# Patient Record
Sex: Female | Born: 1935 | Race: White | Hispanic: No | Marital: Married | Smoking: Never smoker
Health system: Southern US, Community
[De-identification: ages and names within clinical notes are randomized; demographics above are authoritative.]

## PROBLEM LIST (undated history)

## (undated) DIAGNOSIS — Z8719 Personal history of other diseases of the digestive system: Secondary | ICD-10-CM

## (undated) DIAGNOSIS — Z9889 Other specified postprocedural states: Secondary | ICD-10-CM

## (undated) DIAGNOSIS — E039 Hypothyroidism, unspecified: Secondary | ICD-10-CM

## (undated) DIAGNOSIS — C801 Malignant (primary) neoplasm, unspecified: Secondary | ICD-10-CM

## (undated) DIAGNOSIS — M51369 Other intervertebral disc degeneration, lumbar region without mention of lumbar back pain or lower extremity pain: Secondary | ICD-10-CM

## (undated) DIAGNOSIS — E079 Disorder of thyroid, unspecified: Secondary | ICD-10-CM

## (undated) DIAGNOSIS — I2699 Other pulmonary embolism without acute cor pulmonale: Secondary | ICD-10-CM

## (undated) DIAGNOSIS — F32A Depression, unspecified: Secondary | ICD-10-CM

## (undated) DIAGNOSIS — I10 Essential (primary) hypertension: Secondary | ICD-10-CM

## (undated) DIAGNOSIS — J302 Other seasonal allergic rhinitis: Secondary | ICD-10-CM

## (undated) DIAGNOSIS — M1990 Unspecified osteoarthritis, unspecified site: Secondary | ICD-10-CM

## (undated) DIAGNOSIS — R112 Nausea with vomiting, unspecified: Secondary | ICD-10-CM

## (undated) DIAGNOSIS — M4316 Spondylolisthesis, lumbar region: Secondary | ICD-10-CM

## (undated) DIAGNOSIS — K219 Gastro-esophageal reflux disease without esophagitis: Secondary | ICD-10-CM

## (undated) DIAGNOSIS — F419 Anxiety disorder, unspecified: Secondary | ICD-10-CM

## (undated) HISTORY — PX: GANGLION CYST EXCISION: SHX1691

## (undated) HISTORY — PX: CATARACT EXTRACTION W/ INTRAOCULAR LENS IMPLANT: SHX1309

## (undated) HISTORY — DX: Gastro-esophageal reflux disease without esophagitis: K21.9

## (undated) HISTORY — PX: BACK SURGERY: SHX140

## (undated) HISTORY — DX: Unspecified osteoarthritis, unspecified site: M19.90

## (undated) HISTORY — PX: COLONOSCOPY: SHX174

## (undated) HISTORY — PX: BREAST EXCISIONAL BIOPSY: SUR124

## (undated) HISTORY — DX: Disorder of thyroid, unspecified: E07.9

## (undated) HISTORY — DX: Essential (primary) hypertension: I10

## (undated) HISTORY — PX: ABDOMINAL HYSTERECTOMY: SHX81

## (undated) HISTORY — PX: COLONOSCOPY W/ BIOPSIES AND POLYPECTOMY: SHX1376

---

## 1959-02-23 HISTORY — PX: BREAST SURGERY: SHX581

## 1994-02-22 HISTORY — PX: BREAST SURGERY: SHX581

## 1997-08-30 ENCOUNTER — Other Ambulatory Visit: Admission: RE | Admit: 1997-08-30 | Discharge: 1997-08-30 | Payer: Self-pay | Admitting: Obstetrics and Gynecology

## 1998-09-08 ENCOUNTER — Other Ambulatory Visit: Admission: RE | Admit: 1998-09-08 | Discharge: 1998-09-08 | Payer: Self-pay | Admitting: Obstetrics and Gynecology

## 1999-09-23 ENCOUNTER — Other Ambulatory Visit: Admission: RE | Admit: 1999-09-23 | Discharge: 1999-09-23 | Payer: Self-pay | Admitting: Obstetrics and Gynecology

## 2000-10-03 ENCOUNTER — Other Ambulatory Visit: Admission: RE | Admit: 2000-10-03 | Discharge: 2000-10-03 | Payer: Self-pay | Admitting: Obstetrics and Gynecology

## 2003-05-27 ENCOUNTER — Encounter: Admission: RE | Admit: 2003-05-27 | Discharge: 2003-05-27 | Payer: Self-pay | Admitting: Internal Medicine

## 2003-06-05 ENCOUNTER — Encounter: Admission: RE | Admit: 2003-06-05 | Discharge: 2003-06-05 | Payer: Self-pay | Admitting: Internal Medicine

## 2004-08-07 ENCOUNTER — Ambulatory Visit: Payer: Self-pay | Admitting: Gastroenterology

## 2004-08-18 ENCOUNTER — Ambulatory Visit: Payer: Self-pay | Admitting: Gastroenterology

## 2004-09-04 ENCOUNTER — Ambulatory Visit: Payer: Self-pay | Admitting: Gastroenterology

## 2004-09-17 ENCOUNTER — Ambulatory Visit: Payer: Self-pay | Admitting: Gastroenterology

## 2004-09-17 ENCOUNTER — Ambulatory Visit (HOSPITAL_COMMUNITY): Admission: RE | Admit: 2004-09-17 | Discharge: 2004-09-17 | Payer: Self-pay | Admitting: Gastroenterology

## 2006-11-08 ENCOUNTER — Ambulatory Visit (HOSPITAL_COMMUNITY): Admission: RE | Admit: 2006-11-08 | Discharge: 2006-11-08 | Payer: Self-pay | Admitting: Obstetrics and Gynecology

## 2007-11-10 ENCOUNTER — Ambulatory Visit (HOSPITAL_COMMUNITY): Admission: RE | Admit: 2007-11-10 | Discharge: 2007-11-10 | Payer: Self-pay | Admitting: Obstetrics and Gynecology

## 2008-03-15 ENCOUNTER — Encounter: Admission: RE | Admit: 2008-03-15 | Discharge: 2008-03-15 | Payer: Self-pay | Admitting: Podiatry

## 2008-12-03 ENCOUNTER — Ambulatory Visit (HOSPITAL_COMMUNITY): Admission: RE | Admit: 2008-12-03 | Discharge: 2008-12-03 | Payer: Self-pay | Admitting: Obstetrics and Gynecology

## 2010-01-26 ENCOUNTER — Ambulatory Visit (HOSPITAL_COMMUNITY)
Admission: RE | Admit: 2010-01-26 | Discharge: 2010-01-26 | Payer: Self-pay | Source: Home / Self Care | Admitting: Obstetrics and Gynecology

## 2010-02-12 ENCOUNTER — Encounter
Admission: RE | Admit: 2010-02-12 | Discharge: 2010-02-12 | Payer: Self-pay | Source: Home / Self Care | Attending: Internal Medicine | Admitting: Internal Medicine

## 2010-02-19 ENCOUNTER — Encounter
Admission: RE | Admit: 2010-02-19 | Discharge: 2010-02-19 | Payer: Self-pay | Source: Home / Self Care | Attending: Internal Medicine | Admitting: Internal Medicine

## 2011-01-11 ENCOUNTER — Other Ambulatory Visit (HOSPITAL_COMMUNITY): Payer: Self-pay | Admitting: Obstetrics

## 2011-01-11 DIAGNOSIS — Z1231 Encounter for screening mammogram for malignant neoplasm of breast: Secondary | ICD-10-CM

## 2011-02-10 ENCOUNTER — Ambulatory Visit (HOSPITAL_COMMUNITY)
Admission: RE | Admit: 2011-02-10 | Discharge: 2011-02-10 | Disposition: A | Discharge status: Home / Self Care | Payer: Medicare HMO | Source: Ambulatory Visit | Attending: Obstetrics | Admitting: Obstetrics

## 2011-02-10 DIAGNOSIS — Z1231 Encounter for screening mammogram for malignant neoplasm of breast: Secondary | ICD-10-CM | POA: Insufficient documentation

## 2012-01-03 ENCOUNTER — Other Ambulatory Visit: Payer: Self-pay | Admitting: Obstetrics

## 2012-01-03 DIAGNOSIS — Z1231 Encounter for screening mammogram for malignant neoplasm of breast: Secondary | ICD-10-CM

## 2012-02-11 ENCOUNTER — Ambulatory Visit
Admission: RE | Admit: 2012-02-11 | Discharge: 2012-02-11 | Disposition: A | Discharge status: Home / Self Care | Payer: Medicare Other | Source: Ambulatory Visit | Attending: Obstetrics | Admitting: Obstetrics

## 2012-02-11 DIAGNOSIS — Z1231 Encounter for screening mammogram for malignant neoplasm of breast: Secondary | ICD-10-CM

## 2012-05-02 ENCOUNTER — Other Ambulatory Visit: Payer: Self-pay

## 2013-01-08 ENCOUNTER — Other Ambulatory Visit: Payer: Self-pay

## 2013-01-08 DIAGNOSIS — Z1231 Encounter for screening mammogram for malignant neoplasm of breast: Secondary | ICD-10-CM

## 2013-01-17 ENCOUNTER — Encounter: Payer: Self-pay | Admitting: Podiatry

## 2013-01-22 ENCOUNTER — Encounter: Payer: Self-pay | Admitting: Podiatry

## 2013-01-22 ENCOUNTER — Ambulatory Visit (INDEPENDENT_AMBULATORY_CARE_PROVIDER_SITE_OTHER): Payer: Medicare Other | Admitting: Podiatry

## 2013-01-22 ENCOUNTER — Ambulatory Visit (INDEPENDENT_AMBULATORY_CARE_PROVIDER_SITE_OTHER): Payer: Medicare Other

## 2013-01-22 DIAGNOSIS — M79609 Pain in unspecified limb: Secondary | ICD-10-CM

## 2013-01-22 DIAGNOSIS — M722 Plantar fascial fibromatosis: Secondary | ICD-10-CM

## 2013-01-22 MED ORDER — TRIAMCINOLONE ACETONIDE 10 MG/ML IJ SUSP
10.0000 mg | Freq: Once | INTRAMUSCULAR | Status: AC
  Administered 2013-01-22: 10 mg

## 2013-01-22 NOTE — Patient Instructions (Signed)
Plantar Fasciitis (Heel Spur Syndrome)  with Rehab  The plantar fascia is a fibrous, ligament-like, soft-tissue structure that spans the bottom of the foot. Plantar fasciitis is a condition that causes pain in the foot due to inflammation of the tissue.  SYMPTOMS   · Pain and tenderness on the underneath side of the foot.  · Pain that worsens with standing or walking.  CAUSES   Plantar fasciitis is caused by irritation and injury to the plantar fascia on the underneath side of the foot. Common mechanisms of injury include:  · Direct trauma to bottom of the foot.  · Damage to a small nerve that runs under the foot where the main fascia attaches to the heel bone.  · Stress placed on the plantar fascia due to bone spurs.  RISK INCREASES WITH:   · Activities that place stress on the plantar fascia (running, jumping, pivoting, or cutting).  · Poor strength and flexibility.  · Improperly fitted shoes.  · Tight calf muscles.  · Flat feet.  · Failure to warm-up properly before activity.  · Obesity.  PREVENTION  · Warm up and stretch properly before activity.  · Allow for adequate recovery between workouts.  · Maintain physical fitness:  · Strength, flexibility, and endurance.  · Cardiovascular fitness.  · Maintain a health body weight.  · Avoid stress on the plantar fascia.  · Wear properly fitted shoes, including arch supports for individuals who have flat feet.  PROGNOSIS   If treated properly, then the symptoms of plantar fasciitis usually resolve without surgery. However, occasionally surgery is necessary.  RELATED COMPLICATIONS   · Recurrent symptoms that may result in a chronic condition.  · Problems of the lower back that are caused by compensating for the injury, such as limping.  · Pain or weakness of the foot during push-off following surgery.  · Chronic inflammation, scarring, and partial or complete fascia tear, occurring more often from repeated injections.  TREATMENT   Treatment initially involves the use of  ice and medication to help reduce pain and inflammation. The use of strengthening and stretching exercises may help reduce pain with activity, especially stretches of the Achilles tendon. These exercises may be performed at home or with a therapist. Your caregiver may recommend that you use heel cups of arch supports to help reduce stress on the plantar fascia. Occasionally, corticosteroid injections are given to reduce inflammation. If symptoms persist for greater than 6 months despite non-surgical (conservative), then surgery may be recommended.   MEDICATION   · If pain medication is necessary, then nonsteroidal anti-inflammatory medications, such as aspirin and ibuprofen, or other minor pain relievers, such as acetaminophen, are often recommended.  · Do not take pain medication within 7 days before surgery.  · Prescription pain relievers may be given if deemed necessary by your caregiver. Use only as directed and only as much as you need.  · Corticosteroid injections may be given by your caregiver. These injections should be reserved for the most serious cases, because they may only be given a certain number of times.  HEAT AND COLD  · Cold treatment (icing) relieves pain and reduces inflammation. Cold treatment should be applied for 10 to 15 minutes every 2 to 3 hours for inflammation and pain and immediately after any activity that aggravates your symptoms. Use ice packs or massage the area with a piece of ice (ice massage).  · Heat treatment may be used prior to performing the stretching and strengthening activities prescribed   by your caregiver, physical therapist, or athletic trainer. Use a heat pack or soak the injury in warm water.  SEEK IMMEDIATE MEDICAL CARE IF:  · Treatment seems to offer no benefit, or the condition worsens.  · Any medications produce adverse side effects.  EXERCISES  RANGE OF MOTION (ROM) AND STRETCHING EXERCISES - Plantar Fasciitis (Heel Spur Syndrome)  These exercises may help you  when beginning to rehabilitate your injury. Your symptoms may resolve with or without further involvement from your physician, physical therapist or athletic trainer. While completing these exercises, remember:   · Restoring tissue flexibility helps normal motion to return to the joints. This allows healthier, less painful movement and activity.  · An effective stretch should be held for at least 30 seconds.  · A stretch should never be painful. You should only feel a gentle lengthening or release in the stretched tissue.  RANGE OF MOTION - Toe Extension, Flexion  · Sit with your right / left leg crossed over your opposite knee.  · Grasp your toes and gently pull them back toward the top of your foot. You should feel a stretch on the bottom of your toes and/or foot.  · Hold this stretch for __________ seconds.  · Now, gently pull your toes toward the bottom of your foot. You should feel a stretch on the top of your toes and or foot.  · Hold this stretch for __________ seconds.  Repeat __________ times. Complete this stretch __________ times per day.   RANGE OF MOTION - Ankle Dorsiflexion, Active Assisted  · Remove shoes and sit on a chair that is preferably not on a carpeted surface.  · Place right / left foot under knee. Extend your opposite leg for support.  · Keeping your heel down, slide your right / left foot back toward the chair until you feel a stretch at your ankle or calf. If you do not feel a stretch, slide your bottom forward to the edge of the chair, while still keeping your heel down.  · Hold this stretch for __________ seconds.  Repeat __________ times. Complete this stretch __________ times per day.   STRETCH  Gastroc, Standing  · Place hands on wall.  · Extend right / left leg, keeping the front knee somewhat bent.  · Slightly point your toes inward on your back foot.  · Keeping your right / left heel on the floor and your knee straight, shift your weight toward the wall, not allowing your back to  arch.  · You should feel a gentle stretch in the right / left calf. Hold this position for __________ seconds.  Repeat __________ times. Complete this stretch __________ times per day.  STRETCH  Soleus, Standing  · Place hands on wall.  · Extend right / left leg, keeping the other knee somewhat bent.  · Slightly point your toes inward on your back foot.  · Keep your right / left heel on the floor, bend your back knee, and slightly shift your weight over the back leg so that you feel a gentle stretch deep in your back calf.  · Hold this position for __________ seconds.  Repeat __________ times. Complete this stretch __________ times per day.  STRETCH  Gastrocsoleus, Standing   Note: This exercise can place a lot of stress on your foot and ankle. Please complete this exercise only if specifically instructed by your caregiver.   · Place the ball of your right / left foot on a step, keeping   your other foot firmly on the same step.  · Hold on to the wall or a rail for balance.  · Slowly lift your other foot, allowing your body weight to press your heel down over the edge of the step.  · You should feel a stretch in your right / left calf.  · Hold this position for __________ seconds.  · Repeat this exercise with a slight bend in your right / left knee.  Repeat __________ times. Complete this stretch __________ times per day.   STRENGTHENING EXERCISES - Plantar Fasciitis (Heel Spur Syndrome)   These exercises may help you when beginning to rehabilitate your injury. They may resolve your symptoms with or without further involvement from your physician, physical therapist or athletic trainer. While completing these exercises, remember:   · Muscles can gain both the endurance and the strength needed for everyday activities through controlled exercises.  · Complete these exercises as instructed by your physician, physical therapist or athletic trainer. Progress the resistance and repetitions only as guided.  STRENGTH - Towel  Curls  · Sit in a chair positioned on a non-carpeted surface.  · Place your foot on a towel, keeping your heel on the floor.  · Pull the towel toward your heel by only curling your toes. Keep your heel on the floor.  · If instructed by your physician, physical therapist or athletic trainer, add ____________________ at the end of the towel.  Repeat __________ times. Complete this exercise __________ times per day.  STRENGTH - Ankle Inversion  · Secure one end of a rubber exercise band/tubing to a fixed object (table, pole). Loop the other end around your foot just before your toes.  · Place your fists between your knees. This will focus your strengthening at your ankle.  · Slowly, pull your big toe up and in, making sure the band/tubing is positioned to resist the entire motion.  · Hold this position for __________ seconds.  · Have your muscles resist the band/tubing as it slowly pulls your foot back to the starting position.  Repeat __________ times. Complete this exercises __________ times per day.   Document Released: 02/08/2005 Document Revised: 05/03/2011 Document Reviewed: 05/23/2008  [employer]® Patient Information ©2014 [employer].

## 2013-01-22 NOTE — Progress Notes (Signed)
   Subjective:    Patient ID: Debbie Taylor, female    DOB: February 22, 1934, 79 y.o.   MRN: [id number]  HPI my right heel has been bothering me for about 1 month and throbbing and burning and hurt when i get up and hurts on the bottom   Patient has a history of left plantar fasciitis in 2010 and 2011. She currently wears the rigid custom orthotics daily that were dispensed for plantar fasciitis on the left heel.  Review of Systems  Constitutional: Negative.   HENT: Negative.   Eyes: Negative.   Respiratory: Negative.   Cardiovascular: Negative.   Gastrointestinal: Negative.   Endocrine: Negative.   Genitourinary: Negative.   Musculoskeletal: Negative.   Skin: Negative.   Allergic/Immunologic: Negative.   Neurological: Negative.   Hematological: Bruises/bleeds easily.  Psychiatric/Behavioral: Negative.        Objective:   Physical Exam Subjective: Orientated x59 79 year old white female.  Vascular: The DP and PT pulses are two over four bilaterally  Neurological: Sensation intact  Dermatological: Texture and turgor within normal limits.  Musculoskeletal: No restriction ankle subtalar midtarsal joints bilaterally. Palpable tenderness the medial plantar right heel and mid arch right. No palpable lesions.       Assessment & Plan:  Assessment: plantar fasciitis right  Plan: Skin is prepped with alcohol and Betadine and 10 mg of Kenalog mixed with 10 mg of plain Xylocaine and 2.5 mg of plain Marcaine are injected into the right inferior heel, for Kenalog injection #1. Patient will continue to wear her existing rigid orthotics. In addition, a plantar fasciitis strap was dispensed for her to wear on the right foot in conjunction with her orthotics. Shoeing and stretching discussed. She'll return if the symptoms are not improving in the next 30 days.

## 2013-02-12 ENCOUNTER — Ambulatory Visit
Admission: RE | Admit: 2013-02-12 | Discharge: 2013-02-12 | Disposition: A | Discharge status: Home / Self Care | Payer: Medicare Other | Source: Ambulatory Visit

## 2013-02-12 DIAGNOSIS — Z1231 Encounter for screening mammogram for malignant neoplasm of breast: Secondary | ICD-10-CM

## 2014-01-11 ENCOUNTER — Other Ambulatory Visit: Payer: Self-pay

## 2014-01-11 DIAGNOSIS — Z1231 Encounter for screening mammogram for malignant neoplasm of breast: Secondary | ICD-10-CM

## 2014-02-14 ENCOUNTER — Ambulatory Visit
Admission: RE | Admit: 2014-02-14 | Discharge: 2014-02-14 | Disposition: A | Discharge status: Home / Self Care | Payer: Commercial Managed Care - HMO | Source: Ambulatory Visit

## 2014-02-14 DIAGNOSIS — Z1231 Encounter for screening mammogram for malignant neoplasm of breast: Secondary | ICD-10-CM

## 2014-09-09 ENCOUNTER — Encounter: Payer: Self-pay | Admitting: Gastroenterology

## 2014-10-30 ENCOUNTER — Ambulatory Visit (AMBULATORY_SURGERY_CENTER): Payer: Self-pay | Admitting: *Deleted

## 2014-10-30 VITALS — Ht 64.5 in | Wt 184.0 lb

## 2014-10-30 DIAGNOSIS — Z1211 Encounter for screening for malignant neoplasm of colon: Secondary | ICD-10-CM

## 2014-10-30 NOTE — Progress Notes (Signed)
Patient denies any allergies to eggs or soy. Patient denies any problems with sedation, pt does get post-op N & V with anesthesia. Patient denies any oxygen use at home and does not take any diet/weight loss medications. Patient declined EMMI information today.

## 2014-11-12 ENCOUNTER — Encounter: Payer: Self-pay | Admitting: Gastroenterology

## 2014-11-12 ENCOUNTER — Ambulatory Visit (AMBULATORY_SURGERY_CENTER): Payer: Commercial Managed Care - HMO | Admitting: Gastroenterology

## 2014-11-12 VITALS — BP 132/71 | HR 60 | Temp 98.2°F | Resp 20 | Ht 64.5 in | Wt 184.0 lb

## 2014-11-12 DIAGNOSIS — D120 Benign neoplasm of cecum: Secondary | ICD-10-CM

## 2014-11-12 DIAGNOSIS — D123 Benign neoplasm of transverse colon: Secondary | ICD-10-CM

## 2014-11-12 DIAGNOSIS — Z1211 Encounter for screening for malignant neoplasm of colon: Secondary | ICD-10-CM

## 2014-11-12 MED ORDER — SODIUM CHLORIDE 0.9 % IV SOLN: 500.0000 mL | INTRAVENOUS | Status: DC

## 2014-11-12 NOTE — Progress Notes (Signed)
Report to PACU, RN, vss, BBS= Clear.  

## 2014-11-12 NOTE — Progress Notes (Signed)
Called to room to assist during endoscopic procedure.  Patient ID and intended procedure confirmed with present staff. Received instructions for my participation in the procedure from the performing physician.  

## 2014-11-12 NOTE — Patient Instructions (Signed)
YOU HAD AN ENDOSCOPIC PROCEDURE TODAY AT THE [facility]:   Refer to the procedure report that was given to you for any specific questions about what was found during the examination.  If the procedure report does not answer your questions, please call your gastroenterologist to clarify.  If you requested that your care partner not be given the details of your procedure findings, then the procedure report has been included in a sealed envelope for you to review at your convenience later.  YOU SHOULD EXPECT: Some feelings of bloating in the abdomen. Passage of more gas than usual.  Walking can help get rid of the air that was put into your GI tract during the procedure and reduce the bloating. If you had a lower endoscopy (such as a colonoscopy or flexible sigmoidoscopy) you may notice spotting of blood in your stool or on the toilet paper. If you underwent a bowel prep for your procedure, you may not have a normal bowel movement for a few days.  Please Note:  You might notice some irritation and congestion in your nose or some drainage.  This is from the oxygen used during your procedure.  There is no need for concern and it should clear up in a day or so.  SYMPTOMS TO REPORT IMMEDIATELY:   Following lower endoscopy (colonoscopy or flexible sigmoidoscopy):  Excessive amounts of blood in the stool  Significant tenderness or worsening of abdominal pains  Swelling of the abdomen that is new, acute  Fever of 100F or higher    For urgent or emergent issues, a gastroenterologist can be reached at any hour by calling [phone].   DIET: Your first meal following the procedure should be a small meal and then it is ok to progress to your normal diet. Heavy or fried foods are harder to digest and may make you feel nauseous or bloated.  Likewise, meals heavy in dairy and vegetables can increase bloating.  Drink plenty of fluids but you should avoid alcoholic beverages for 24  hours.  ACTIVITY:  You should plan to take it easy for the rest of today and you should NOT DRIVE or use heavy machinery until tomorrow (because of the sedation medicines used during the test).    FOLLOW UP: Our staff will call the number listed on your records the next business day following your procedure to check on you and address any questions or concerns that you may have regarding the information given to you following your procedure. If we do not reach you, we will leave a message.  However, if you are feeling well and you are not experiencing any problems, there is no need to return our call.  We will assume that you have returned to your regular daily activities without incident.  If any biopsies were taken you will be contacted by phone or by letter within the next 1-3 weeks.  Please call us at [phone] if you have not heard about the biopsies in 3 weeks.    SIGNATURES/CONFIDENTIALITY: You and/or your care partner have signed paperwork which will be entered into your electronic medical record.  These signatures attest to the fact that that the information above on your After Visit Summary has been reviewed and is understood.  Full responsibility of the confidentiality of this discharge information lies with you and/or your care-partner.    INFORMATION ON POLYPS,DIVERTICULOSIS ,HEMORRHOIDS ,& HIGH FIBER DIET GIVEN TO YOU TODAY 

## 2014-11-12 NOTE — Op Note (Signed)
[place]  [address] [place], [address]   COLONOSCOPY PROCEDURE REPORT  PATIENT: Debbie Taylor, Debbie Taylor  MR#: [id number] BIRTHDATE: [birth year] , 81  yrs. old GENDER: female ENDOSCOPIST: Milus Banister, MD REFERRED BY:W.  Lutricia Feil, M.D. PROCEDURE DATE:  11/12/2014 PROCEDURE:   Colonoscopy, screening and Colonoscopy with snare polypectomy First Screening Colonoscopy - Avg.  risk and is 50 yrs.  old or older - No.  Prior Negative Screening - Now for repeat screening. 10 or more years since last screening  History of Adenoma - Now for follow-up colonoscopy & has been > or = to 3 yrs.  N/A  Polyps removed today? Yes ASA CLASS:   Class II INDICATIONS:Screening for colonic neoplasia and Colorectal Neoplasm Risk Assessment for this procedure is average risk. MEDICATIONS: Monitored anesthesia care and Propofol 200 mg IV  DESCRIPTION OF PROCEDURE:   After the risks benefits and alternatives of the procedure were thoroughly explained, informed consent was obtained.  The digital rectal exam revealed no abnormalities of the rectum.   The LB [id number]  endoscope was introduced through the anus and advanced to the cecum, which was identified by both the appendix and ileocecal valve. No adverse events experienced.   The quality of the prep was excellent.  The instrument was then slowly withdrawn as the colon was fully examined. Estimated blood loss is zero unless otherwise noted in this procedure report.  COLON FINDINGS: A sessile polyp measuring 3 mm in size was found at the cecum.  A polypectomy was performed with a cold snare.  The resection was complete, the polyp tissue was completely retrieved and sent to histology.   A pedunculated polyp measuring 7 mm in size was found in the transverse colon.  A polypectomy was performed using snare cautery.  The resection was complete, the polyp tissue was completely retrieved and sent to histology. There was mild  diverticulosis noted in the left colon.   Small external and internal hemorrhoids were found.   The examination was otherwise normal.  Retroflexed views revealed no abnormalities. The time to cecum = 3.8 Withdrawal time = 8.3   The scope was withdrawn and the procedure completed. COMPLICATIONS: There were no immediate complications.  ENDOSCOPIC IMPRESSION: 1.   Sessile polyp was found at the cecum; polypectomy was performed with a cold snare 2.   Pedunculated polyp was found in the transverse colon; polypectomy was performed using snare cautery 3.   Mild diverticulosis was noted in the left colon 4.   Small external and internal hemorrhoids 5.   The examination was otherwise normal  RECOMMENDATIONS: Await final pathology results. Since colon cancer screening usually stop around the age 36 you may not need further colonoscopies for screening.  eSigned:  Milus Banister, MD 11/12/2014 8:14 AM revise

## 2014-11-13 ENCOUNTER — Telehealth: Payer: Self-pay | Admitting: *Deleted

## 2014-11-13 NOTE — Telephone Encounter (Signed)
  Follow up Call-  Call back number 11/12/2014  Post procedure Call Back phone  # [phone]  Permission to leave phone message Yes     Patient questions:  Do you have a fever, pain , or abdominal swelling? No. Pain Score  0 *  Have you tolerated food without any problems? Yes.    Have you been able to return to your normal activities? Yes.    Do you have any questions about your discharge instructions: Diet   No. Medications  No. Follow up visit  No.  Do you have questions or concerns about your Care? No.  Actions: * If pain score is 4 or above: No action needed, pain <4.

## 2014-11-18 ENCOUNTER — Encounter: Payer: Self-pay | Admitting: Gastroenterology

## 2015-01-06 ENCOUNTER — Other Ambulatory Visit: Payer: Self-pay

## 2015-01-06 DIAGNOSIS — Z1231 Encounter for screening mammogram for malignant neoplasm of breast: Secondary | ICD-10-CM

## 2015-02-18 ENCOUNTER — Ambulatory Visit
Admission: RE | Admit: 2015-02-18 | Discharge: 2015-02-18 | Disposition: A | Discharge status: Home / Self Care | Payer: Commercial Managed Care - HMO | Source: Ambulatory Visit

## 2015-02-18 DIAGNOSIS — Z1231 Encounter for screening mammogram for malignant neoplasm of breast: Secondary | ICD-10-CM

## 2016-01-12 ENCOUNTER — Other Ambulatory Visit: Payer: Self-pay | Admitting: Internal Medicine

## 2016-01-12 DIAGNOSIS — Z1231 Encounter for screening mammogram for malignant neoplasm of breast: Secondary | ICD-10-CM

## 2016-02-20 ENCOUNTER — Ambulatory Visit
Admission: RE | Admit: 2016-02-20 | Discharge: 2016-02-20 | Disposition: A | Discharge status: Home / Self Care | Payer: Commercial Managed Care - HMO | Source: Ambulatory Visit | Attending: Internal Medicine | Admitting: Internal Medicine

## 2016-02-20 DIAGNOSIS — Z1231 Encounter for screening mammogram for malignant neoplasm of breast: Secondary | ICD-10-CM

## 2016-05-26 DIAGNOSIS — L509 Urticaria, unspecified: Secondary | ICD-10-CM | POA: Diagnosis not present

## 2016-05-26 DIAGNOSIS — Z6830 Body mass index (BMI) 30.0-30.9, adult: Secondary | ICD-10-CM | POA: Diagnosis not present

## 2016-06-01 DIAGNOSIS — R69 Illness, unspecified: Secondary | ICD-10-CM | POA: Diagnosis not present

## 2016-07-09 DIAGNOSIS — E039 Hypothyroidism, unspecified: Secondary | ICD-10-CM | POA: Diagnosis not present

## 2016-07-09 DIAGNOSIS — Z6830 Body mass index (BMI) 30.0-30.9, adult: Secondary | ICD-10-CM | POA: Diagnosis not present

## 2016-07-09 DIAGNOSIS — M545 Low back pain: Secondary | ICD-10-CM | POA: Diagnosis not present

## 2016-07-09 DIAGNOSIS — E785 Hyperlipidemia, unspecified: Secondary | ICD-10-CM | POA: Diagnosis not present

## 2016-07-09 DIAGNOSIS — I10 Essential (primary) hypertension: Secondary | ICD-10-CM | POA: Diagnosis not present

## 2016-07-09 DIAGNOSIS — Z8249 Family history of ischemic heart disease and other diseases of the circulatory system: Secondary | ICD-10-CM | POA: Diagnosis not present

## 2016-07-09 DIAGNOSIS — K219 Gastro-esophageal reflux disease without esophagitis: Secondary | ICD-10-CM | POA: Diagnosis not present

## 2016-07-09 DIAGNOSIS — Z79899 Other long term (current) drug therapy: Secondary | ICD-10-CM | POA: Diagnosis not present

## 2016-07-09 DIAGNOSIS — Z0000 Encounter for general adult medical examination without abnormal findings: Secondary | ICD-10-CM | POA: Diagnosis not present

## 2016-07-09 DIAGNOSIS — M25551 Pain in right hip: Secondary | ICD-10-CM | POA: Diagnosis not present

## 2016-07-13 DIAGNOSIS — I10 Essential (primary) hypertension: Secondary | ICD-10-CM | POA: Diagnosis not present

## 2016-07-13 DIAGNOSIS — R7301 Impaired fasting glucose: Secondary | ICD-10-CM | POA: Diagnosis not present

## 2016-07-13 DIAGNOSIS — E784 Other hyperlipidemia: Secondary | ICD-10-CM | POA: Diagnosis not present

## 2016-07-13 DIAGNOSIS — E038 Other specified hypothyroidism: Secondary | ICD-10-CM | POA: Diagnosis not present

## 2016-07-20 DIAGNOSIS — H8110 Benign paroxysmal vertigo, unspecified ear: Secondary | ICD-10-CM | POA: Diagnosis not present

## 2016-07-20 DIAGNOSIS — N183 Chronic kidney disease, stage 3 (moderate): Secondary | ICD-10-CM | POA: Diagnosis not present

## 2016-07-20 DIAGNOSIS — E784 Other hyperlipidemia: Secondary | ICD-10-CM | POA: Diagnosis not present

## 2016-07-20 DIAGNOSIS — K219 Gastro-esophageal reflux disease without esophagitis: Secondary | ICD-10-CM | POA: Diagnosis not present

## 2016-07-20 DIAGNOSIS — I10 Essential (primary) hypertension: Secondary | ICD-10-CM | POA: Diagnosis not present

## 2016-07-20 DIAGNOSIS — M5416 Radiculopathy, lumbar region: Secondary | ICD-10-CM | POA: Diagnosis not present

## 2016-07-20 DIAGNOSIS — E668 Other obesity: Secondary | ICD-10-CM | POA: Diagnosis not present

## 2016-07-20 DIAGNOSIS — E038 Other specified hypothyroidism: Secondary | ICD-10-CM | POA: Diagnosis not present

## 2016-07-20 DIAGNOSIS — N390 Urinary tract infection, site not specified: Secondary | ICD-10-CM | POA: Diagnosis not present

## 2016-07-20 DIAGNOSIS — Z0000 Encounter for general adult medical examination without abnormal findings: Secondary | ICD-10-CM | POA: Diagnosis not present

## 2016-07-20 DIAGNOSIS — R7301 Impaired fasting glucose: Secondary | ICD-10-CM | POA: Diagnosis not present

## 2016-07-26 ENCOUNTER — Encounter: Payer: Self-pay | Admitting: Physical Therapy

## 2016-07-26 ENCOUNTER — Ambulatory Visit: Payer: Medicare HMO | Attending: Internal Medicine | Admitting: Physical Therapy

## 2016-07-26 DIAGNOSIS — G8929 Other chronic pain: Secondary | ICD-10-CM | POA: Insufficient documentation

## 2016-07-26 DIAGNOSIS — R262 Difficulty in walking, not elsewhere classified: Secondary | ICD-10-CM | POA: Insufficient documentation

## 2016-07-26 DIAGNOSIS — M545 Low back pain: Secondary | ICD-10-CM | POA: Diagnosis not present

## 2016-07-26 DIAGNOSIS — M6281 Muscle weakness (generalized): Secondary | ICD-10-CM | POA: Insufficient documentation

## 2016-07-26 NOTE — Therapy (Signed)
[place], [place], [address] Phone: [phone]   Fax:  [phone]  Physical Therapy Evaluation  Patient Details  Name: Debbie Taylor MRN: [id number] Date of Birth: 1933/10/31 Referring Provider: Marton Redwood, MD  Encounter Date: 07/26/2016      PT End of Session - 07/26/16 1026    Visit Number 1   Number of Visits 9   Date for PT Re-Evaluation 08/27/16   Authorization Type MCR   PT Start Time 1020   PT Stop Time 1113   PT Time Calculation (min) 53 min   Activity Tolerance Patient tolerated treatment well   Behavior During Therapy [facility] for tasks assessed/performed      Past Medical History:  Diagnosis Date  . Arthritis   . GERD (gastroesophageal reflux disease)   . Hypertension   . Thyroid disease     Past Surgical History:  Procedure Laterality Date  . ABDOMINAL HYSTERECTOMY    . BREAST SURGERY Right 1961  . BREAST SURGERY Left 1996   tumor  . GANGLION CYST EXCISION      There were no vitals filed for this visit.       Subjective Assessment - 07/26/16 1026    Subjective R LBP that extends down lateral leg and sometimes below knee. Noticed leg was bothering her a little bit last year at her physical. Exercises provided by MD helped a little bit. Progressively worsened over the last year.    How long can you stand comfortably? discomfort toward the beginning of churtch   How long can you walk comfortably? 2 hours    Patient Stated Goals shopping, wash dishes, making bed, stand in church    Currently in Pain? Yes   Pain Score 5   8/10 in last 3 days at worst   Pain Location Leg   Pain Orientation Right;Lateral   Pain Descriptors / Indicators Aching   Pain Type Chronic pain   Pain Onset More than a month ago   Pain Frequency Intermittent   Aggravating Factors  standing, walking long periods   Pain Relieving Factors exercises, rest            [facility] PT Assessment - 07/26/16  0001      Assessment   Medical Diagnosis LBP   Referring Provider Marton Redwood, MD   Hand Dominance Right   Next MD Visit PRN   Prior Therapy not this year     Precautions   Precautions None   Precaution Comments high BP-well controlled     Restrictions   Weight Bearing Restrictions No     Balance Screen   Has the patient fallen in the past 6 months No     [place] residence   Living Arrangements Spouse/significant other   Additional Comments split level home     Prior Function   Level of Independence Independent     Cognition   Overall Cognitive Status Within Functional Limits for tasks assessed     Observation/Other Assessments   Focus on Therapeutic Outcomes (FOTO)  55% ability (goal 66%)     Sensation   Additional Comments occasional tingling in R ankle with increased pain     ROM / Strength   AROM / PROM / Strength Strength     Strength   Strength Assessment Site Hip   Right/Left Hip Right   Right Hip Extension 3-/5   Right Hip ABduction 3+/5  Palpation   Palpation comment TTP L3 PA pressure            Objective measurements completed on examination: See above findings.          [facility] Adult PT Treatment/Exercise - 07/26/16 0001      Therapeutic Activites    Therapeutic Activities ADL's   ADL's log roll for proper bed mobility     Exercises   Exercises Lumbar     Lumbar Exercises: Stretches   Single Knee to Chest Stretch 20 seconds  both   Double Knee to Chest Stretch 20 seconds   Pelvic Tilt Limitations seated in chair, cues to keep small range   Piriformis Stretch Limitations figure 4 push, pull                PT Education - 07/26/16 1126    Education provided Yes   Education Details anatomy of condition, POC, HEP, exercise form/rationale, stretches/exercises into daily activities   Person(s) Educated Patient   Methods Explanation;Demonstration;Tactile cues;Verbal cues;Handout    Comprehension Verbalized understanding;Returned demonstration;Verbal cues required;Tactile cues required;Need further instruction          PT Short Term Goals - 07/26/16 1137      PT SHORT TERM GOAL #1   Title FOTO to 66% ability to indicate significant improvement in functional ability by 7/6   Baseline 555 ability at eval   Time 4   Period Weeks   Status New     PT SHORT TERM GOAL #2   Title Pt will be able to stand in church without being limited by LBP   Baseline pain early in the service notable   Time 4   Period Weeks   Status New     PT SHORT TERM GOAL #3   Title Pt will be able to return to walking with her friend for exercise and, paired with stretching, not be limited by LBP   Baseline has stopped walking at eval due to pain   Time 4   Period Weeks   Status New     PT SHORT TERM GOAL #4   Title Pt will be able to make her bed LBP <=3/10   Baseline 8/10 pain at eval   Time 4   Period Weeks   Status New     PT SHORT TERM GOAL #5   Title Pt will be independent in HEP in order to continue strengthening and stretching following d/c   Baseline will establish and progress as appropriate through [place]   Time 4   Period Weeks   Status New                   Plan - 07/26/16 1130    Clinical Impression Statement Pt presents to PT with complaints of LBP that begins in low lumbar extending to R lateral thigh and occasionally to ankle. Pt denies any regular stretching regimen but has noticed that the ones the MD provided were somewhat helpful, thinks she may not be doing them correctly. Pt with limited mobility in lumbar region and decreased strength in supporting musculature. Pt will benefit from skilled PT in order to improve mobility and challenge functional strength/endurance to meet long term goals.    History and Personal Factors relevant to plan of care: history of CA   Clinical Presentation Evolving   Clinical Presentation due to: symptoms gradually  worsening   Clinical Decision Making Low   Rehab Potential Good   PT Frequency 2x /  week   PT Duration 6 weeks   PT Treatment/Interventions ADLs/Self Care Home Management;Cryotherapy;Functional mobility training;Stair training;Gait training;Traction;Moist Heat;Therapeutic activities;Therapeutic exercise;Balance training;Neuromuscular re-education;Patient/family education;Passive range of motion;Manual techniques;Taping;Dry needling;Electrical Stimulation;Iontophoresis 4mg /ml Dexamethasone;Ultrasound   PT Next Visit Plan nu step, review stretching regimen, gastroc stretch, hip abductor & exensor strengthening   PT Home Exercise Plan stretches: single & double knee to chest, figure 4, thomas; seated pelvic tilts, log roll;    Consulted and Agree with Plan of Care Patient      Patient will benefit from skilled therapeutic intervention in order to improve the following deficits and impairments:  Difficulty walking, Increased muscle spasms, Decreased activity tolerance, Pain, Improper body mechanics, Impaired flexibility, Hypomobility, Decreased strength, Decreased mobility, Postural dysfunction  Visit Diagnosis: Chronic right-sided low back pain, with sciatica presence unspecified - Plan: PT plan of care cert/re-cert  Difficulty in walking, not elsewhere classified - Plan: PT plan of care cert/re-cert  Muscle weakness (generalized) - Plan: PT plan of care cert/re-cert      G-Codes - 09/81/19 1140    Functional Assessment Tool Used (Outpatient Only) FOTO 55% ability (goal 66%), clinical judgement   Functional Limitation Mobility: Walking and moving around   Mobility: Walking and Moving Around Current Status (J4782) At least 40 percent but less than 60 percent impaired, limited or restricted   Mobility: Walking and Moving Around Goal Status (N5621) At least 20 percent but less than 40 percent impaired, limited or restricted       Problem List There are no active problems to display for  this patient.   Kail Fraley C. Selso Mannor PT, DPT 07/26/16 11:43 AM   [facility] [address] [place], [place], [address] Phone: [phone]   Fax:  [phone]  Name: CAOIMHE DAMRON MRN: [id number] Date of Birth: 11/25/1933

## 2016-07-28 ENCOUNTER — Other Ambulatory Visit: Payer: Self-pay | Admitting: Internal Medicine

## 2016-07-28 DIAGNOSIS — M5416 Radiculopathy, lumbar region: Secondary | ICD-10-CM

## 2016-07-30 ENCOUNTER — Ambulatory Visit: Payer: Medicare HMO | Admitting: Physical Therapy

## 2016-07-30 DIAGNOSIS — M545 Low back pain: Principal | ICD-10-CM

## 2016-07-30 DIAGNOSIS — R262 Difficulty in walking, not elsewhere classified: Secondary | ICD-10-CM

## 2016-07-30 DIAGNOSIS — G8929 Other chronic pain: Secondary | ICD-10-CM

## 2016-07-30 DIAGNOSIS — M6281 Muscle weakness (generalized): Secondary | ICD-10-CM

## 2016-07-30 NOTE — Patient Instructions (Addendum)
Bridge    Lie back, legs bent. Inhale, pressing hips up. Keeping ribs in, lengthen lower back. Exhale, rolling down along spine from top. Repeat __10__ times. Do __2__ sessions per day.  External Rotation: Hip - Knees Apart (Hook-Lying)    Lie with hips and knees bent, band tied just above knees. Pull knees apart. Hold for _5__ seconds. Slowly return. Repeat _20__ times. Do _2__ times a day.  Calf Stretch    Stand with hands supported on wall, elbows slightly bent, front knee bent, back knee straight, feet parallel and both heels on floor. Lean into wall by pushing hips forward until a stretch is felt in calf muscle. Hold _20___ seconds. Repeat with leg positions switched. Repeat x 2   Copyright  VHI. All rights reserved.

## 2016-07-30 NOTE — Therapy (Signed)
[place], [place], [address] Phone: [phone]   Fax:  [phone]  Physical Therapy Treatment  Patient Details  Name: CHEMEKA FILICE MRN: [id number] Date of Birth: [birth year] Referring Provider: Marton Redwood, MD  Encounter Date: 07/30/2016      PT End of Session - 07/30/16 1110    Visit Number 2   Number of Visits 9   Date for PT Re-Evaluation 08/27/16   Authorization Type MCR   PT Start Time 1103   PT Stop Time 1142   PT Time Calculation (min) 39 min      Past Medical History:  Diagnosis Date  . Arthritis   . GERD (gastroesophageal reflux disease)   . Hypertension   . Thyroid disease     Past Surgical History:  Procedure Laterality Date  . ABDOMINAL HYSTERECTOMY    . BREAST SURGERY Right 1961  . BREAST SURGERY Left 1996   tumor  . GANGLION CYST EXCISION      There were no vitals filed for this visit.      Subjective Assessment - 07/30/16 1108    Subjective Pain is still there but not as bad.    Currently in Pain? Yes   Pain Score 4    Pain Location Back   Pain Orientation Mid   Pain Descriptors / Indicators Aching;Sore   Pain Radiating Towards right buttock and leg                          [facility] Adult PT Treatment/Exercise - 07/30/16 0001      Lumbar Exercises: Stretches   Single Knee to Chest Stretch 20 seconds;2 reps  both   Double Knee to Chest Stretch 20 seconds;2 reps   Pelvic Tilt Limitations seated in chair, cues to keep small range   Piriformis Stretch Limitations figure 4 push, pull  2 x 20 sec each     Lumbar Exercises: Aerobic   Stationary Bike Nustep L4 x 6 min UE/LE     Lumbar Exercises: Supine   Glut Set 10 reps   Bridge 20 reps   Other Supine Lumbar Exercises red band clam x 20      Ankle Exercises: Stretches   Gastroc Stretch 3 reps;30 seconds   Gastroc Stretch Limitations runners stretch at sink                PT  Education - 07/30/16 1124    Education provided Yes   Education Details HEP   Person(s) Educated Patient   Methods Explanation;Handout   Comprehension Verbalized understanding          PT Short Term Goals - 07/26/16 1137      PT SHORT TERM GOAL #1   Title FOTO to 66% ability to indicate significant improvement in functional ability by 7/6   Baseline 555 ability at eval   Time 4   Period Weeks   Status New     PT SHORT TERM GOAL #2   Title Pt will be able to stand in church without being limited by LBP   Baseline pain early in the service notable   Time 4   Period Weeks   Status New     PT SHORT TERM GOAL #3   Title Pt will be able to return to walking with her friend for exercise and, paired with stretching, not be limited by LBP   Baseline has stopped walking at eval due  to pain   Time 4   Period Weeks   Status New     PT SHORT TERM GOAL #4   Title Pt will be able to make her bed LBP <=3/10   Baseline 8/10 pain at eval   Time 4   Period Weeks   Status New     PT SHORT TERM GOAL #5   Title Pt will be independent in HEP in order to continue strengthening and stretching following d/c   Baseline will establish and progress as appropriate through POC   Time 4   Period Weeks   Status New                  Plan - 07/30/16 1136    Clinical Impression Statement Pt reports decreased pain intensity with prescribed HEP. We reviewed all HEP with pt requiring cues for supine <=> sit transfers and pelvic tilts. Began hip strengthenin and gastroc stretching and updated HEP. Pt reports no increased pain and feels good after treatment.    PT Next Visit Plan nu step, review stretching regimen, gastroc stretch, hip abductor & exensor strengthening   PT Home Exercise Plan stretches: single & double knee to chest, figure 4, thomas; seated pelvic tilts, log roll, gastroc stretch, supine clam with red band, bridge    Consulted and Agree with Plan of Care Patient       Patient will benefit from skilled therapeutic intervention in order to improve the following deficits and impairments:  Difficulty walking, Increased muscle spasms, Decreased activity tolerance, Pain, Improper body mechanics, Impaired flexibility, Hypomobility, Decreased strength, Decreased mobility, Postural dysfunction  Visit Diagnosis: Chronic right-sided low back pain, with sciatica presence unspecified  Difficulty in walking, not elsewhere classified  Muscle weakness (generalized)     Problem List There are no active problems to display for this patient.   Dorene Ar, [place] 07/30/2016, 11:53 AM  [facility] [address] [place], [place], [address] Phone: [phone]   Fax:  [phone]  Name: SHAKERRIA PARRAN MRN: [id number] Date of Birth: Jul 20, 1933

## 2016-08-02 ENCOUNTER — Ambulatory Visit: Payer: Medicare HMO | Admitting: Physical Therapy

## 2016-08-02 ENCOUNTER — Encounter: Payer: Self-pay | Admitting: Physical Therapy

## 2016-08-02 DIAGNOSIS — G8929 Other chronic pain: Secondary | ICD-10-CM

## 2016-08-02 DIAGNOSIS — M6281 Muscle weakness (generalized): Secondary | ICD-10-CM

## 2016-08-02 DIAGNOSIS — M545 Low back pain: Principal | ICD-10-CM

## 2016-08-02 DIAGNOSIS — R262 Difficulty in walking, not elsewhere classified: Secondary | ICD-10-CM

## 2016-08-02 NOTE — Therapy (Signed)
[place], [place], [address] Phone: [phone]   Fax:  [phone]  Physical Therapy Treatment  Patient Details  Name: Debbie Taylor MRN: [id number] Date of Birth: September 30, 1933 Referring Provider: Marton Redwood, MD  Encounter Date: 08/02/2016      PT End of Session - 08/02/16 0754    Visit Number 3   Number of Visits 9   Date for PT Re-Evaluation 08/27/16   Authorization Type MCR   PT Start Time 0801   PT Stop Time 0845   PT Time Calculation (min) 44 min   Activity Tolerance Patient tolerated treatment well   Behavior During Therapy [facility] for tasks assessed/performed      Past Medical History:  Diagnosis Date  . Arthritis   . GERD (gastroesophageal reflux disease)   . Hypertension   . Thyroid disease     Past Surgical History:  Procedure Laterality Date  . ABDOMINAL HYSTERECTOMY    . BREAST SURGERY Right 1961  . BREAST SURGERY Left 1996   tumor  . GANGLION CYST EXCISION      There were no vitals filed for this visit.      Subjective Assessment - 08/02/16 0801    Subjective Still feels it in her R hip a little bit but not going down the leg as bad. Stretches at home are helpful when her back starts to feel tired.    Patient Stated Goals shopping, wash dishes, making bed, stand in church    Currently in Pain? Yes   Pain Score 4    Pain Location Hip   Pain Orientation Right   Pain Descriptors / Indicators Sore                         [facility] Adult PT Treatment/Exercise - 08/02/16 0001      Therapeutic Activites    ADL's log roll, squat for objects on floor     Exercises   Exercises Knee/Hip     Lumbar Exercises: Stretches   Lower Trunk Rotation 3 reps;10 seconds   Piriformis Stretch Limitations figure 4 pull 30s     Lumbar Exercises: Aerobic   Stationary Bike nu step L5 5 min     Lumbar Exercises: Standing   Functional Squats Limitations mini squats at  sink-cues for core/gluts     Knee/Hip Exercises: Stretches   Gastroc Stretch 2 reps;30 seconds   Gastroc Stretch Limitations slant board     Knee/Hip Exercises: Supine   Bridges Limitations red tband, cues for core engagement x10   Bridges with Clamshell 10 reps  red tband     Manual Therapy   Manual Therapy Soft tissue mobilization   Manual therapy comments use of tennis ball at home for trigger point release   Soft tissue mobilization roller R hip & ITB; QL                PT Education - 08/02/16 0805    Education provided Yes   Education Details exercise form/rationale, HEP   Person(s) Educated Patient   Methods Explanation;Demonstration;Tactile cues;Verbal cues   Comprehension Verbalized understanding;Returned demonstration;Verbal cues required;Tactile cues required;Need further instruction          PT Short Term Goals - 07/26/16 1137      PT SHORT TERM GOAL #1   Title FOTO to 66% ability to indicate significant improvement in functional ability by 7/6   Baseline 555 ability at eval  Time 4   Period Weeks   Status New     PT SHORT TERM GOAL #2   Title Pt will be able to stand in church without being limited by LBP   Baseline pain early in the service notable   Time 4   Period Weeks   Status New     PT SHORT TERM GOAL #3   Title Pt will be able to return to walking with her friend for exercise and, paired with stretching, not be limited by LBP   Baseline has stopped walking at eval due to pain   Time 4   Period Weeks   Status New     PT SHORT TERM GOAL #4   Title Pt will be able to make her bed LBP <=3/10   Baseline 8/10 pain at eval   Time 4   Period Weeks   Status New     PT SHORT TERM GOAL #5   Title Pt will be independent in HEP in order to continue strengthening and stretching following d/c   Baseline will establish and progress as appropriate through POC   Time 4   Period Weeks   Status New                  Plan - 08/02/16  0848    Clinical Impression Statement Cuing required for proper log roll and bending to grab objects from floor. Pt is able to feelt he difference in her back with both of these movements in proper form and was asked to slow down and focus on what she is doing. Denied modalities post treatment   PT Treatment/Interventions ADLs/Self Care Home Management;Cryotherapy;Functional mobility training;Stair training;Gait training;Traction;Moist Heat;Therapeutic activities;Therapeutic exercise;Balance training;Neuromuscular re-education;Patient/family education;Passive range of motion;Manual techniques;Taping;Dry needling;Electrical Stimulation;Iontophoresis 4mg /ml Dexamethasone;Ultrasound   PT Next Visit Plan add thomas test stretch ,hip abductor & exensor strengthening; review bend/squat for objects on floor.    PT Home Exercise Plan stretches: single & double knee to chest, figure 4, thomas; seated pelvic tilts, log roll, gastroc stretch, supine clam with red band, bridge    Consulted and Agree with Plan of Care Patient      Patient will benefit from skilled therapeutic intervention in order to improve the following deficits and impairments:  Difficulty walking, Increased muscle spasms, Decreased activity tolerance, Pain, Improper body mechanics, Impaired flexibility, Hypomobility, Decreased strength, Decreased mobility, Postural dysfunction  Visit Diagnosis: Chronic right-sided low back pain, with sciatica presence unspecified  Difficulty in walking, not elsewhere classified  Muscle weakness (generalized)     Problem List There are no active problems to display for this patient.   Mare Ludtke C. Khizar Fiorella PT, DPT 08/02/16 8:52 AM   [place] [facility] [address] [place], [place], [address] Phone: [phone]   Fax:  [phone]  Name: Debbie Taylor MRN: [id number] Date of Birth: 1933/10/22

## 2016-08-04 ENCOUNTER — Ambulatory Visit: Payer: Medicare HMO | Admitting: Physical Therapy

## 2016-08-04 DIAGNOSIS — M545 Low back pain: Secondary | ICD-10-CM | POA: Diagnosis not present

## 2016-08-04 DIAGNOSIS — G8929 Other chronic pain: Secondary | ICD-10-CM

## 2016-08-04 DIAGNOSIS — M6281 Muscle weakness (generalized): Secondary | ICD-10-CM

## 2016-08-04 DIAGNOSIS — R262 Difficulty in walking, not elsewhere classified: Secondary | ICD-10-CM

## 2016-08-04 NOTE — Therapy (Signed)
[place], [place], [address] Phone: [phone]   Fax:  [phone]  Physical Therapy Treatment  Patient Details  Name: Debbie Taylor MRN: [id number] Date of Birth: 09/18/1933 Referring Provider: Marton Redwood, MD  Encounter Date: 08/04/2016      PT End of Session - 08/04/16 0951    Visit Number 4   Number of Visits 9   Date for PT Re-Evaluation 08/27/16   Authorization Type MCR   PT Start Time 0930   PT Stop Time 1015   PT Time Calculation (min) 45 min      Past Medical History:  Diagnosis Date  . Arthritis   . GERD (gastroesophageal reflux disease)   . Hypertension   . Thyroid disease     Past Surgical History:  Procedure Laterality Date  . ABDOMINAL HYSTERECTOMY    . BREAST SURGERY Right 1961  . BREAST SURGERY Left 1996   tumor  . GANGLION CYST EXCISION      There were no vitals filed for this visit.      Subjective Assessment - 08/04/16 0932    Subjective On my feet alot yesterday, stiff today. Soreness in the right hip.    Pain Score 4    Pain Location Hip   Pain Orientation Right   Pain Descriptors / Indicators Sore   Aggravating Factors  standing/walking prolonged    Pain Relieving Factors exercises, rest                          [facility] Adult PT Treatment/Exercise - 08/04/16 0001      Therapeutic Activites    ADL's log roll, squat for objects on floor, golfers pick up      Lumbar Exercises: Stretches   Single Knee to Chest Stretch 20 seconds;2 reps  both   Standing Extension 10 seconds;5 reps   Prone on Elbows Stretch 60 seconds   Piriformis Stretch Limitations figure 4 pull 30s     Lumbar Exercises: Aerobic   Stationary Bike nu step L5 6 min     Lumbar Exercises: Standing   Functional Squats Limitations mini squats at sink-cues for core/gluts     Lumbar Exercises: Supine   Bridge 20 reps   Other Supine Lumbar Exercises green band clam x 20       Knee/Hip Exercises: Stretches   Hip Flexor Stretch 20 seconds;3 reps     Knee/Hip Exercises: Supine   Bridges with Clamshell 10 reps  green band      Manual Therapy   Soft tissue mobilization active release with pressure ro piriformis during passive IR/ER iin prone                   PT Short Term Goals - 07/26/16 1137      PT SHORT TERM GOAL #1   Title FOTO to 66% ability to indicate significant improvement in functional ability by 7/6   Baseline 555 ability at eval   Time 4   Period Weeks   Status New     PT SHORT TERM GOAL #2   Title Pt will be able to stand in church without being limited by LBP   Baseline pain early in the service notable   Time 4   Period Weeks   Status New     PT SHORT TERM GOAL #3   Title Pt will be able to return to walking with her friend for exercise  and, paired with stretching, not be limited by LBP   Baseline has stopped walking at eval due to pain   Time 4   Period Weeks   Status New     PT SHORT TERM GOAL #4   Title Pt will be able to make her bed LBP <=3/10   Baseline 8/10 pain at eval   Time 4   Period Weeks   Status New     PT SHORT TERM GOAL #5   Title Pt will be independent in HEP in order to continue strengthening and stretching following d/c   Baseline will establish and progress as appropriate through [place]   Time 4   Period Weeks   Status New                  Plan - 08/04/16 0954    Clinical Impression Statement Pt reports continued soreness in right hip especially with helping pack up her family member's home. She notices a 5/10 pain while standing at church. [place] 1 hour with 4-5/10 pain in right hip which is better than 8/10 at initial eval. Performed soft tissue work to right glute/piriformis with pt still feeling pain at end of session upon standing. She has not used tennis ball yet at home, asked her to work on this area as instructed on last visit. Progressing  toward STGs.    PT Next Visit Plan add thomas test stretch ,hip abductor & exensor strengthening; review bend/squat for objects on floor.    PT Home Exercise Plan stretches: single & double knee to chest, figure 4, thomas; seated pelvic tilts, log roll, gastroc stretch, supine clam with red band, bridge    Consulted and Agree with Plan of Care Patient      Patient will benefit from skilled therapeutic intervention in order to improve the following deficits and impairments:  Difficulty walking, Increased muscle spasms, Decreased activity tolerance, Pain, Improper body mechanics, Impaired flexibility, Hypomobility, Decreased strength, Decreased mobility, Postural dysfunction  Visit Diagnosis: Chronic right-sided low back pain, with sciatica presence unspecified  Difficulty in walking, not elsewhere classified  Muscle weakness (generalized)     Problem List There are no active problems to display for this patient.   Dorene Ar, [place] 08/04/2016, 10:22 AM  [facility] [address] [place], [place], [address] Phone: [phone]   Fax:  [phone]  Name: Debbie Taylor MRN: [id number] Date of Birth: [birth year]

## 2016-08-06 ENCOUNTER — Encounter: Payer: Medicare HMO | Admitting: Physical Therapy

## 2016-08-09 ENCOUNTER — Ambulatory Visit: Payer: Medicare HMO | Admitting: Physical Therapy

## 2016-08-09 DIAGNOSIS — R262 Difficulty in walking, not elsewhere classified: Secondary | ICD-10-CM

## 2016-08-09 DIAGNOSIS — M545 Low back pain: Principal | ICD-10-CM

## 2016-08-09 DIAGNOSIS — M6281 Muscle weakness (generalized): Secondary | ICD-10-CM

## 2016-08-09 DIAGNOSIS — G8929 Other chronic pain: Secondary | ICD-10-CM

## 2016-08-09 NOTE — Therapy (Signed)
[address], [place], [address] Phone: [phone]   Fax:  [phone]  Physical Therapy Treatment  Patient Details  Name: Debbie Taylor MRN: [id number] Date of Birth: 1934/01/03 Referring Provider: Marton Redwood, MD  Encounter Date: 08/09/2016      PT End of Session - 08/09/16 0940    Visit Number 5   Number of Visits 9   Date for PT Re-Evaluation 08/27/16   Authorization Type MCR   PT Start Time 0930   PT Stop Time 1015   PT Time Calculation (min) 45 min      Past Medical History:  Diagnosis Date  . Arthritis   . GERD (gastroesophageal reflux disease)   . Hypertension   . Thyroid disease     Past Surgical History:  Procedure Laterality Date  . ABDOMINAL HYSTERECTOMY    . BREAST SURGERY Right 1961  . BREAST SURGERY Left 1996   tumor  . GANGLION CYST EXCISION      There were no vitals filed for this visit.      Subjective Assessment - 08/09/16 0936    Subjective I felt a pain down the back of my leg this morning, not unusual.    Currently in Pain? Yes   Pain Score 4    Pain Location Hip   Pain Orientation Right   Pain Radiating Towards right buttock and leg             [facility] PT Assessment - 08/09/16 0001      Strength   Right/Left Hip Right;Left   Right Hip Extension 3-/5   Left Hip Extension 3-/5                     [facility] Adult PT Treatment/Exercise - 08/09/16 0001      Lumbar Exercises: Stretches   Prone on Elbows Stretch 60 seconds   Press Ups 3 reps;10 seconds     Lumbar Exercises: Aerobic   Stationary Bike nu step L5 6 min     Lumbar Exercises: Supine   Bridge 20 reps   Bridge Limitations arms crossed and uncrossed, then knees over ball arms crossed and un crosssed    Other Supine Lumbar Exercises hamstring curls with feet on ball and abdominal brace      Lumbar Exercises: Sidelying   Clam 20 reps   Clam Limitations reverse clam x 20      Knee/Hip Exercises: Standing   Other Standing Knee Exercises hip extension x 10 each, max cues      Knee/Hip Exercises: Seated   Sit to Sand 10 reps     Knee/Hip Exercises: Supine   Bridges with Clamshell 10 reps   Single Leg Bridge --  cramps so disc.                   PT Short Term Goals - 08/09/16 0951      PT SHORT TERM GOAL #1   Title FOTO to 66% ability to indicate significant improvement in functional ability by 7/6   Time 4   Period Weeks   Status On-going     PT SHORT TERM GOAL #2   Title Pt will be able to stand in church without being limited by LBP   Baseline better, still has pain    Time 4   Period Weeks   Status On-going     PT SHORT TERM GOAL #3   Title Pt will be able  to return to walking with her friend for exercise and, paired with stretching, not be limited by LBP   Baseline has stopped walking at eval due to pain   Time 4   Period Weeks   Status On-going     PT SHORT TERM GOAL #4   Title Pt will be able to make her bed LBP <=3/10   Baseline 4/10, better    Time 4   Period Weeks   Status On-going     PT SHORT TERM GOAL #5   Title Pt will be independent in HEP in order to continue strengthening and stretching following d/c   Baseline will establish and progress as appropriate through POC   Time 4   Period Weeks   Status On-going                  Plan - 08/09/16 0949    Clinical Impression Statement Pt reports pain is intermittant however can still reach 4/10 daily with prolonged activity and upon waking each morning. She is consistent with all HEP. She continues with intermittent radicular pain downt to her ankle. In prone she is unable to lift into hip extension due to hip flexor tightness. Worked on hip flexor stretching and glute activation exercises. No increased pain.    PT Next Visit Plan continue thomas test stretch ,hip abductor & exensor strengthening; review bend/squat for objects on floor. Traction??   PT Home  Exercise Plan stretches: single & double knee to chest, figure 4, thomas; seated pelvic tilts, log roll, gastroc stretch, supine clam with red band, bridge    Consulted and Agree with Plan of Care Patient      Patient will benefit from skilled therapeutic intervention in order to improve the following deficits and impairments:  Difficulty walking, Increased muscle spasms, Decreased activity tolerance, Pain, Improper body mechanics, Impaired flexibility, Hypomobility, Decreased strength, Decreased mobility, Postural dysfunction  Visit Diagnosis: Chronic right-sided low back pain, with sciatica presence unspecified  Difficulty in walking, not elsewhere classified  Muscle weakness (generalized)     Problem List There are no active problems to display for this patient.   Dorene Ar, [place] 08/09/2016, 11:33 AM  [facility] [address] [place], [place], [address] Phone: [phone]   Fax:  [phone]  Name: Debbie Taylor MRN: [id number] Date of Birth: 1933-10-07

## 2016-08-11 ENCOUNTER — Ambulatory Visit: Payer: Medicare HMO | Admitting: Physical Therapy

## 2016-08-11 ENCOUNTER — Encounter: Payer: Self-pay | Admitting: Physical Therapy

## 2016-08-11 DIAGNOSIS — G8929 Other chronic pain: Secondary | ICD-10-CM

## 2016-08-11 DIAGNOSIS — R262 Difficulty in walking, not elsewhere classified: Secondary | ICD-10-CM

## 2016-08-11 DIAGNOSIS — M6281 Muscle weakness (generalized): Secondary | ICD-10-CM

## 2016-08-11 DIAGNOSIS — M545 Low back pain: Principal | ICD-10-CM

## 2016-08-11 NOTE — Therapy (Signed)
[place], [place], [address] Phone: [phone]   Fax:  [phone]  Physical Therapy Treatment  Patient Details  Name: Debbie Taylor MRN: [id number] Date of Birth: 09/02/1933 Referring Provider: Marton Redwood, MD  Encounter Date: 08/11/2016      PT End of Session - 08/11/16 0930    Visit Number 6   Number of Visits 9   Date for PT Re-Evaluation 08/27/16   Authorization Type MCR   PT Start Time 0931   PT Stop Time 1012   PT Time Calculation (min) 41 min   Activity Tolerance Patient tolerated treatment well   Behavior During Therapy [facility] for tasks assessed/performed      Past Medical History:  Diagnosis Date  . Arthritis   . GERD (gastroesophageal reflux disease)   . Hypertension   . Thyroid disease     Past Surgical History:  Procedure Laterality Date  . ABDOMINAL HYSTERECTOMY    . BREAST SURGERY Right 1961  . BREAST SURGERY Left 1996   tumor  . GANGLION CYST EXCISION      There were no vitals filed for this visit.      Subjective Assessment - 08/11/16 0932    Subjective A little ache down the side of my leg. Hip spot continues to be achey. Mild tingling in lower leg. It is better than when she began, no longer a sharp pain.    Patient Stated Goals shopping, wash dishes, making bed, stand in church    Currently in Pain? Yes   Pain Score 3    Pain Location Hip   Pain Orientation Right   Pain Descriptors / Indicators Sore                         [facility] Adult PT Treatment/Exercise - 08/11/16 0001      Lumbar Exercises: Stretches   Lower Trunk Rotation Limitations 3x each side   Piriformis Stretch Limitations figure 4 pull 20s each     Lumbar Exercises: Aerobic   Stationary Bike nu step L4 5 min     Knee/Hip Exercises: Seated   Sit to Sand 10 reps;without UE support     Knee/Hip Exercises: Supine   Bridges with [employer] 15 reps   Straight Leg Raise with  External Rotation Both;10 reps     Manual Therapy   Soft tissue mobilization IASTM R hip          Trigger Point Dry Needling - 08/11/16 9833    Consent Given? Yes   Education Handout Provided --  verbal education   Muscles Treated Lower Body Gluteus maximus;Piriformis   Gluteus Maximus Response Twitch response elicited;Palpable increased muscle length   Piriformis Response Palpable increased muscle length              PT Education - 08/11/16 1019    Education provided Yes   Education Details exercise form/rationale, TPDN & expected outcomes, sleeping posture, AM stiffness   Person(s) Educated Patient   Methods Explanation;Demonstration;Tactile cues;Verbal cues   Comprehension Verbalized understanding;Returned demonstration;Verbal cues required;Tactile cues required;Need further instruction          PT Short Term Goals - 08/09/16 0951      PT SHORT TERM GOAL #1   Title FOTO to 66% ability to indicate significant improvement in functional ability by 7/6   Time 4   Period Weeks   Status On-going     PT SHORT TERM  GOAL #2   Title Pt will be able to stand in church without being limited by LBP   Baseline better, still has pain    Time 4   Period Weeks   Status On-going     PT SHORT TERM GOAL #3   Title Pt will be able to return to walking with her friend for exercise and, paired with stretching, not be limited by LBP   Baseline has stopped walking at eval due to pain   Time 4   Period Weeks   Status On-going     PT SHORT TERM GOAL #4   Title Pt will be able to make her bed LBP <=3/10   Baseline 4/10, better    Time 4   Period Weeks   Status On-going     PT SHORT TERM GOAL #5   Title Pt will be independent in HEP in order to continue strengthening and stretching following d/c   Baseline will establish and progress as appropriate through POC   Time 4   Period Weeks   Status On-going                  Plan - 08/11/16 1013    Clinical  Impression Statement DN utilized to reduce trigger points in R glut max and piriformis. Pt verbalized resolution of all referred pain into leg following. Discussed sleeping posture with pilow bw knees and LTRs to decrease AM stiffness.    PT Treatment/Interventions ADLs/Self Care Home Management;Cryotherapy;Functional mobility training;Stair training;Gait training;Traction;Moist Heat;Therapeutic activities;Therapeutic exercise;Balance training;Neuromuscular re-education;Patient/family education;Passive range of motion;Manual techniques;Taping;Dry needling;Electrical Stimulation;Iontophoresis 4mg /ml Dexamethasone;Ultrasound   PT Next Visit Plan review bend and squat, traction if DN did not resolve pain into leg   PT Home Exercise Plan stretches: single & double knee to chest, figure 4, thomas; seated pelvic tilts, log roll, gastroc stretch, supine clam with red band, bridge; stand with glut sets   Consulted and Agree with Plan of Care Patient      Patient will benefit from skilled therapeutic intervention in order to improve the following deficits and impairments:  Difficulty walking, Increased muscle spasms, Decreased activity tolerance, Pain, Improper body mechanics, Impaired flexibility, Hypomobility, Decreased strength, Decreased mobility, Postural dysfunction  Visit Diagnosis: Chronic right-sided low back pain, with sciatica presence unspecified  Difficulty in walking, not elsewhere classified  Muscle weakness (generalized)     Problem List There are no active problems to display for this patient.  Esther Bradstreet C. Bryton Romagnoli PT, DPT 08/11/16 10:20 AM   [facility] [address] [place], [place], [address] Phone: [phone]   Fax:  [phone]  Name: SANAZ SCARLETT MRN: [id number] Date of Birth: Aug 01, 1933

## 2016-08-16 ENCOUNTER — Ambulatory Visit: Payer: Medicare HMO | Admitting: Physical Therapy

## 2016-08-16 DIAGNOSIS — G8929 Other chronic pain: Secondary | ICD-10-CM

## 2016-08-16 DIAGNOSIS — M545 Low back pain: Secondary | ICD-10-CM | POA: Diagnosis not present

## 2016-08-16 DIAGNOSIS — R262 Difficulty in walking, not elsewhere classified: Secondary | ICD-10-CM

## 2016-08-16 DIAGNOSIS — M6281 Muscle weakness (generalized): Secondary | ICD-10-CM

## 2016-08-16 NOTE — Therapy (Signed)
[place], [place], [address] Phone: [phone]   Fax:  [phone]  Physical Therapy Treatment  Patient Details  Name: Debbie Taylor MRN: [id number] Date of Birth: 1933/12/14 Referring Provider: Marton Redwood, MD  Encounter Date: 08/16/2016      PT End of Session - 08/16/16 0945    Visit Number 7   Number of Visits 9   Date for PT Re-Evaluation 08/27/16   Authorization Type MCR   PT Start Time 0930   PT Stop Time 1015   PT Time Calculation (min) 45 min      Past Medical History:  Diagnosis Date  . Arthritis   . GERD (gastroesophageal reflux disease)   . Hypertension   . Thyroid disease     Past Surgical History:  Procedure Laterality Date  . ABDOMINAL HYSTERECTOMY    . BREAST SURGERY Right 1961  . BREAST SURGERY Left 1996   tumor  . GANGLION CYST EXCISION      There were no vitals filed for this visit.      Subjective Assessment - 08/16/16 0953    Subjective Dry needling helped but I went for a 20 minute walk and had increased back, hip and leg pain for 2 days, better today. My left upper back is hurting some today.    Currently in Pain? Yes   Pain Score 2    Pain Location Hip   Aggravating Factors  prolonged walk   Pain Relieving Factors exercises,rest , dry needle                          [facility] Adult PT Treatment/Exercise - 08/16/16 0001      Lumbar Exercises: Stretches   Lower Trunk Rotation Limitations 3x each side   Piriformis Stretch Limitations figure 4 pull 20s each     Lumbar Exercises: Aerobic   Stationary Bike nu step L5 8 min     Lumbar Exercises: Supine   Bridge 20 reps   Bridge Limitations feet on and off ball, arms crossed-caused increase left upper back pain.      Lumbar Exercises: Sidelying   Other Sidelying Lumbar Exercises QL stretch over towel roll during manual      Knee/Hip Exercises: Supine   Bridges with Ball Squeeze 15 reps   Straight Leg Raise with External Rotation Both;10 reps     Manual Therapy   Soft tissue mobilization IASTM R hip, Left quadratus and paraspinals                   PT Short Term Goals - 08/09/16 0951      PT SHORT TERM GOAL #1   Title FOTO to 66% ability to indicate significant improvement in functional ability by 7/6   Time 4   Period Weeks   Status On-going     PT SHORT TERM GOAL #2   Title Pt will be able to stand in church without being limited by LBP   Baseline better, still has pain    Time 4   Period Weeks   Status On-going     PT SHORT TERM GOAL #3   Title Pt will be able to return to walking with her friend for exercise and, paired with stretching, not be limited by LBP   Baseline has stopped walking at eval due to pain   Time 4   Period Weeks   Status On-going  PT SHORT TERM GOAL #4   Title Pt will be able to make her bed LBP <=3/10   Baseline 4/10, better    Time 4   Period Weeks   Status On-going     PT SHORT TERM GOAL #5   Title Pt will be independent in HEP in order to continue strengthening and stretching following d/c   Baseline will establish and progress as appropriate through POC   Time 4   Period Weeks   Status On-going                  Plan - 08/16/16 1038    Clinical Impression Statement Pt reports TPDN helpful until she went for a 20 minute walk and experienced increaed pain in left lumbar, right hip and down right leg for the next 2 days. She is better today and only rates pain at 2/10. Continued core and hip strength and performed IASTM to lumbar and hip.    PT Next Visit Plan review bend and squat, traction if DN did not resolve pain into leg, check goals    PT Home Exercise Plan stretches: single & double knee to chest, figure 4, thomas; seated pelvic tilts, log roll, gastroc stretch, supine clam with red band, bridge; stand with glut sets   Consulted and Agree with Plan of Care Patient      Patient will benefit  from skilled therapeutic intervention in order to improve the following deficits and impairments:  Difficulty walking, Increased muscle spasms, Decreased activity tolerance, Pain, Improper body mechanics, Impaired flexibility, Hypomobility, Decreased strength, Decreased mobility, Postural dysfunction  Visit Diagnosis: Chronic right-sided low back pain, with sciatica presence unspecified  Difficulty in walking, not elsewhere classified  Muscle weakness (generalized)     Problem List There are no active problems to display for this patient.   Dorene Ar, [place] 08/16/2016, 10:39 AM  [facility] [address] [place], [place], [address] Phone: [phone]   Fax:  [phone]  Name: Debbie Taylor MRN: [id number] Date of Birth: 1933-07-09

## 2016-08-17 ENCOUNTER — Ambulatory Visit
Admission: RE | Admit: 2016-08-17 | Discharge: 2016-08-17 | Disposition: A | Discharge status: Home / Self Care | Payer: Medicare HMO | Source: Ambulatory Visit | Attending: Internal Medicine | Admitting: Internal Medicine

## 2016-08-17 ENCOUNTER — Encounter: Payer: Self-pay | Admitting: Radiology

## 2016-08-17 DIAGNOSIS — M5416 Radiculopathy, lumbar region: Secondary | ICD-10-CM

## 2016-08-17 DIAGNOSIS — M48061 Spinal stenosis, lumbar region without neurogenic claudication: Secondary | ICD-10-CM | POA: Diagnosis not present

## 2016-08-18 ENCOUNTER — Encounter: Payer: Self-pay | Admitting: Physical Therapy

## 2016-08-18 ENCOUNTER — Ambulatory Visit: Payer: Medicare HMO | Admitting: Physical Therapy

## 2016-08-18 DIAGNOSIS — R262 Difficulty in walking, not elsewhere classified: Secondary | ICD-10-CM

## 2016-08-18 DIAGNOSIS — M545 Low back pain: Principal | ICD-10-CM

## 2016-08-18 DIAGNOSIS — G8929 Other chronic pain: Secondary | ICD-10-CM

## 2016-08-18 DIAGNOSIS — M6281 Muscle weakness (generalized): Secondary | ICD-10-CM

## 2016-08-18 NOTE — Therapy (Signed)
[place], [place], [address] Phone: [phone]   Fax:  [phone]  Physical Therapy Treatment/Discharge Summary  Patient Details  Name: Debbie Taylor MRN: [id number] Date of Birth: 08/04/1933 Referring Provider: Marton Redwood, MD  Encounter Date: 08/18/2016      PT End of Session - 08/18/16 0931    Visit Number 8   Number of Visits 9   Date for PT Re-Evaluation 08/27/16   Authorization Type MCR   PT Start Time 0931   PT Stop Time 0957   PT Time Calculation (min) 26 min   Activity Tolerance Patient tolerated treatment well   Behavior During Therapy [facility] for tasks assessed/performed      Past Medical History:  Diagnosis Date  . Arthritis   . GERD (gastroesophageal reflux disease)   . Hypertension   . Thyroid disease     Past Surgical History:  Procedure Laterality Date  . ABDOMINAL HYSTERECTOMY    . BREAST SURGERY Right 1961  . BREAST SURGERY Left 1996   tumor  . GANGLION CYST EXCISION      There were no vitals filed for this visit.      Subjective Assessment - 08/18/16 0931    Subjective DN last wed, felt okay & went for a walk on Turs about 1/4 min (about 20 min). Friday felt sore in low back and hip, felt better on Saturday. Pt reports feeling pretty good today, sore spot in hip in the same spot. Soreness is about the same in the spot. Exercises help when her back feels tired.   Patient Stated Goals shopping, wash dishes, making bed, stand in church    Currently in Pain? Yes   Pain Score 1    Pain Location Hip   Pain Orientation Right   Pain Descriptors / Indicators Sore   Aggravating Factors  the more I do   Pain Relieving Factors rest            [facility] PT Assessment - 08/18/16 0001      Assessment   Medical Diagnosis LBP   Referring Provider Marton Redwood, MD     Observation/Other Assessments   Focus on Therapeutic Outcomes (FOTO)  58% ability                      [facility] Adult PT Treatment/Exercise - 08/18/16 0001      Lumbar Exercises: Aerobic   Stationary Bike nu step 10 min L5                PT Education - 08/18/16 0940    Education provided Yes   Education Details exercise form/rationale, HEP, challenging endurance and reducing fatigue, taking rest breaks   Person(s) Educated Patient   Methods Explanation;Demonstration;Tactile cues;Verbal cues   Comprehension Verbalized understanding;Returned demonstration;Verbal cues required;Tactile cues required;Need further instruction          PT Short Term Goals - 08/18/16 0945      PT SHORT TERM GOAL #1   Title FOTO to 66% ability to indicate significant improvement in functional ability by 7/6   Baseline 58% ability, improved from eval   Status Not Met     PT SHORT TERM GOAL #2   Title Pt will be able to stand in church without being limited by LBP   Baseline able   Status Achieved     PT SHORT TERM GOAL #3   Title Pt will be able to return to walking  with her friend for exercise and, paired with stretching, not be limited by LBP   Baseline has returned, soreness following   Status Partially Met     PT SHORT TERM GOAL #4   Title Pt will be able to make her bed LBP <=3/10   Baseline able   Status Achieved     PT SHORT TERM GOAL #5   Title Pt will be independent in HEP in order to continue strengthening and stretching following d/c   Baseline feels comfortable and independent   Status Achieved                  Plan - 2016/08/30 0957    Clinical Impression Statement pt reports feeling "like a whole new person" and is comfortable doing her exercises at home. Is prepared to d/c today since she is going out of town next week. Pt was educated on improving walking endurance from this point which she verbalized understanding. Pt was instructed to contact us with any further questions.    PT Treatment/Interventions ADLs/Self Care Home  Management;Cryotherapy;Functional mobility training;Stair training;Gait training;Traction;Moist Heat;Therapeutic activities;Therapeutic exercise;Balance training;Neuromuscular re-education;Patient/family education;Passive range of motion;Manual techniques;Taping;Dry needling;Electrical Stimulation;Iontophoresis 56m/ml Dexamethasone;Ultrasound      Patient will benefit from skilled therapeutic intervention in order to improve the following deficits and impairments:  Difficulty walking, Increased muscle spasms, Decreased activity tolerance, Pain, Improper body mechanics, Impaired flexibility, Hypomobility, Decreased strength, Decreased mobility, Postural dysfunction  Visit Diagnosis: Chronic right-sided low back pain, with sciatica presence unspecified  Difficulty in walking, not elsewhere classified  Muscle weakness (generalized)       G-Codes - [id number]    Functional Assessment Tool Used (Outpatient Only) FOTO 58% ability (goal 66%), clinical judgement   Functional Limitation Mobility: Walking and moving around   Mobility: Walking and Moving Around Goal Status ([phone] At least 20 percent but less than 40 percent impaired, limited or restricted   Mobility: Walking and Moving Around Discharge Status ([phone] At least 20 percent but less than 40 percent impaired, limited or restricted      Problem List There are no active problems to display for this patient.  PHYSICAL THERAPY DISCHARGE SUMMARY  Visits from Start of Care: 8  Current functional level related to goals / functional outcomes: See above   Remaining deficits: See above   Education / Equipment: Anatomy of condition, POC, HEP, exercise form/rationale  Plan: Patient agrees to discharge.  Patient goals were partially met. Patient is being discharged due to being pleased with the current functional level.  ?????     C.  PT, DPT 009-Jul-201810:02 AM    JJanett BillowC.  PT, DPT 0July 09, 201810:01  AM   [facility] [address]Phone: [phone]  Fax:  [phone] Name: Debbie WEIDEMRN: [id number] of Birth: 612/23/1935

## 2016-08-23 ENCOUNTER — Ambulatory Visit: Payer: Medicare HMO | Admitting: Physical Therapy

## 2016-08-27 ENCOUNTER — Ambulatory Visit: Payer: Medicare HMO | Admitting: Physical Therapy

## 2016-09-20 DIAGNOSIS — Z6830 Body mass index (BMI) 30.0-30.9, adult: Secondary | ICD-10-CM | POA: Diagnosis not present

## 2016-09-20 DIAGNOSIS — L509 Urticaria, unspecified: Secondary | ICD-10-CM | POA: Diagnosis not present

## 2016-09-20 DIAGNOSIS — T783XXA Angioneurotic edema, initial encounter: Secondary | ICD-10-CM | POA: Diagnosis not present

## 2016-09-30 DIAGNOSIS — L501 Idiopathic urticaria: Secondary | ICD-10-CM | POA: Diagnosis not present

## 2016-09-30 DIAGNOSIS — J3089 Other allergic rhinitis: Secondary | ICD-10-CM | POA: Diagnosis not present

## 2016-09-30 DIAGNOSIS — T783XXA Angioneurotic edema, initial encounter: Secondary | ICD-10-CM | POA: Diagnosis not present

## 2016-10-04 DIAGNOSIS — T783XXA Angioneurotic edema, initial encounter: Secondary | ICD-10-CM | POA: Diagnosis not present

## 2016-10-04 DIAGNOSIS — L501 Idiopathic urticaria: Secondary | ICD-10-CM | POA: Diagnosis not present

## 2016-11-20 DIAGNOSIS — Z23 Encounter for immunization: Secondary | ICD-10-CM | POA: Diagnosis not present

## 2016-12-08 DIAGNOSIS — Z961 Presence of intraocular lens: Secondary | ICD-10-CM | POA: Diagnosis not present

## 2016-12-08 DIAGNOSIS — H5789 Other specified disorders of eye and adnexa: Secondary | ICD-10-CM | POA: Diagnosis not present

## 2016-12-08 DIAGNOSIS — H52203 Unspecified astigmatism, bilateral: Secondary | ICD-10-CM | POA: Diagnosis not present

## 2016-12-08 DIAGNOSIS — H5213 Myopia, bilateral: Secondary | ICD-10-CM | POA: Diagnosis not present

## 2016-12-08 DIAGNOSIS — H524 Presbyopia: Secondary | ICD-10-CM | POA: Diagnosis not present

## 2016-12-08 DIAGNOSIS — Z0101 Encounter for examination of eyes and vision with abnormal findings: Secondary | ICD-10-CM | POA: Diagnosis not present

## 2016-12-14 DIAGNOSIS — M48062 Spinal stenosis, lumbar region with neurogenic claudication: Secondary | ICD-10-CM | POA: Diagnosis not present

## 2016-12-14 DIAGNOSIS — M5416 Radiculopathy, lumbar region: Secondary | ICD-10-CM | POA: Diagnosis not present

## 2016-12-14 DIAGNOSIS — I10 Essential (primary) hypertension: Secondary | ICD-10-CM | POA: Diagnosis not present

## 2016-12-14 DIAGNOSIS — Z6829 Body mass index (BMI) 29.0-29.9, adult: Secondary | ICD-10-CM | POA: Diagnosis not present

## 2017-01-10 ENCOUNTER — Other Ambulatory Visit: Payer: Self-pay | Admitting: Internal Medicine

## 2017-01-10 DIAGNOSIS — Z139 Encounter for screening, unspecified: Secondary | ICD-10-CM

## 2017-02-21 ENCOUNTER — Ambulatory Visit
Admission: RE | Admit: 2017-02-21 | Discharge: 2017-02-21 | Disposition: A | Discharge status: Home / Self Care | Payer: Medicare HMO | Source: Ambulatory Visit | Attending: Internal Medicine | Admitting: Internal Medicine

## 2017-02-21 DIAGNOSIS — Z1231 Encounter for screening mammogram for malignant neoplasm of breast: Secondary | ICD-10-CM | POA: Diagnosis not present

## 2017-02-21 DIAGNOSIS — Z139 Encounter for screening, unspecified: Secondary | ICD-10-CM

## 2017-03-29 DIAGNOSIS — I10 Essential (primary) hypertension: Secondary | ICD-10-CM | POA: Diagnosis not present

## 2017-03-29 DIAGNOSIS — Z6830 Body mass index (BMI) 30.0-30.9, adult: Secondary | ICD-10-CM | POA: Diagnosis not present

## 2017-03-29 DIAGNOSIS — M48062 Spinal stenosis, lumbar region with neurogenic claudication: Secondary | ICD-10-CM | POA: Diagnosis not present

## 2017-04-14 ENCOUNTER — Other Ambulatory Visit: Payer: Self-pay | Admitting: Neurosurgery

## 2017-04-29 DIAGNOSIS — J029 Acute pharyngitis, unspecified: Secondary | ICD-10-CM | POA: Diagnosis not present

## 2017-04-29 DIAGNOSIS — Z6830 Body mass index (BMI) 30.0-30.9, adult: Secondary | ICD-10-CM | POA: Diagnosis not present

## 2017-04-29 DIAGNOSIS — J020 Streptococcal pharyngitis: Secondary | ICD-10-CM | POA: Diagnosis not present

## 2017-04-29 DIAGNOSIS — J0100 Acute maxillary sinusitis, unspecified: Secondary | ICD-10-CM | POA: Diagnosis not present

## 2017-04-29 DIAGNOSIS — I10 Essential (primary) hypertension: Secondary | ICD-10-CM | POA: Diagnosis not present

## 2017-05-09 DIAGNOSIS — J3089 Other allergic rhinitis: Secondary | ICD-10-CM | POA: Diagnosis not present

## 2017-05-09 DIAGNOSIS — L501 Idiopathic urticaria: Secondary | ICD-10-CM | POA: Diagnosis not present

## 2017-05-09 DIAGNOSIS — T783XXA Angioneurotic edema, initial encounter: Secondary | ICD-10-CM | POA: Diagnosis not present

## 2017-05-10 NOTE — Pre-Procedure Instructions (Signed)
Debbie Taylor  05/10/2017      [place], [place] [address] [place] [place] [address] Phone: [phone] Fax: [phone]    Your procedure is scheduled on Thursday March 28.  Report to [facility] Admitting at 6:45 A.M.  Call this number if you have problems the morning of surgery:  [phone]   Remember:  Do not eat food or drink liquids after midnight.  Take these medicines the morning of surgery with A SIP OF WATER:   Metoprolol (Toprol-XL) Levothyroxine (synthroid) Amlodipine (norvasc) Amoxicillin-clavulanate (Augmentin) Pantoprazole (Protonix)  Nasonex if needed Tylenol (Acetaminophen) if needed  7 days prior to surgery STOP taking any Aleve, Naproxen, Ibuprofen, Motrin, Advil, Goody's, BC's, all herbal medications, fish oil, and all vitamins  **Follow your surgeon's instructions on stopping Aspirin. If no instructions were given, please call your surgeon's office**   Do not wear jewelry, make-up or nail polish.  Do not wear lotions, powders, or perfumes, or deodorant.  Do not shave 48 hours prior to surgery.  Men may shave face and neck.  Do not bring valuables to the hospital.  [facility] is not responsible for any belongings or valuables.  Contacts, dentures or bridgework may not be worn into surgery.  Leave your suitcase in the car.  After surgery it may be brought to your room.  For patients admitted to the hospital, discharge time will be determined by your treatment team.  Patients discharged the day of surgery will not be allowed to drive home.    Special instructions:    [facility]- Preparing For Surgery  Before surgery, you can play an important role. Because skin is not sterile, your skin needs to be as free of germs as possible. You can reduce the number of germs on your skin by washing with CHG (chlorahexidine gluconate) Soap before surgery.  CHG is an antiseptic cleaner  which kills germs and bonds with the skin to continue killing germs even after washing.  Please do not use if you have an allergy to CHG or antibacterial soaps. If your skin becomes reddened/irritated stop using the CHG.  Do not shave (including legs and underarms) for at least 48 hours prior to first CHG shower. It is OK to shave your face.  Please follow these instructions carefully.   1. Shower the NIGHT BEFORE SURGERY and the MORNING OF SURGERY with CHG.   2. If you chose to wash your hair, wash your hair first as usual with your normal shampoo.  3. After you shampoo, rinse your hair and body thoroughly to remove the shampoo.  4. Use CHG as you would any other liquid soap. You can apply CHG directly to the skin and wash gently with a scrungie or a clean washcloth.   5. Apply the CHG Soap to your body ONLY FROM THE NECK DOWN.  Do not use on open wounds or open sores. Avoid contact with your eyes, ears, mouth and genitals (private parts). Wash Face and genitals (private parts)  with your normal soap.  6. Wash thoroughly, paying special attention to the area where your surgery will be performed.  7. Thoroughly rinse your body with warm water from the neck down.  8. DO NOT shower/wash with your normal soap after using and rinsing off the CHG Soap.  9. Pat yourself dry with a CLEAN TOWEL.  10. Wear CLEAN PAJAMAS to bed the night before surgery, wear comfortable clothes the morning  of surgery  11. Place CLEAN SHEETS on your bed the night of your first shower and DO NOT SLEEP WITH PETS.    Day of Surgery: Do not apply any deodorants/lotions. Please wear clean clothes to the hospital/surgery center.      Please read over the following fact sheets that you were given. Coughing and Deep Breathing, MRSA Information and Surgical Site Infection Prevention

## 2017-05-11 ENCOUNTER — Encounter (HOSPITAL_COMMUNITY)
Admission: RE | Admit: 2017-05-11 | Discharge: 2017-05-11 | Disposition: A | Discharge status: Home / Self Care | Payer: Medicare HMO | Source: Ambulatory Visit | Attending: Neurosurgery | Admitting: Neurosurgery

## 2017-05-11 ENCOUNTER — Other Ambulatory Visit: Payer: Self-pay

## 2017-05-11 ENCOUNTER — Encounter (HOSPITAL_COMMUNITY): Payer: Self-pay

## 2017-05-11 DIAGNOSIS — Z01812 Encounter for preprocedural laboratory examination: Secondary | ICD-10-CM | POA: Diagnosis not present

## 2017-05-11 DIAGNOSIS — Z01810 Encounter for preprocedural cardiovascular examination: Secondary | ICD-10-CM | POA: Diagnosis not present

## 2017-05-11 DIAGNOSIS — M4316 Spondylolisthesis, lumbar region: Secondary | ICD-10-CM | POA: Diagnosis not present

## 2017-05-11 HISTORY — DX: Other specified postprocedural states: Z98.890

## 2017-05-11 HISTORY — DX: Other pulmonary embolism without acute cor pulmonale: I26.99

## 2017-05-11 HISTORY — DX: Hypothyroidism, unspecified: E03.9

## 2017-05-11 HISTORY — DX: Nausea with vomiting, unspecified: R11.2

## 2017-05-11 LAB — SURGICAL PCR SCREEN
MRSA, PCR: NEGATIVE
Staphylococcus aureus: NEGATIVE

## 2017-05-11 LAB — BASIC METABOLIC PANEL
ANION GAP: 7 (ref 5–15)
BUN: 24 mg/dL — ABNORMAL HIGH (ref 6–20)
CO2: 26 mmol/L (ref 22–32)
Calcium: 9.6 mg/dL (ref 8.9–10.3)
Chloride: 107 mmol/L (ref 101–111)
Creatinine, Ser: 0.87 mg/dL (ref 0.44–1.00)
GFR calc non Af Amer: >60 mL/min (ref >60)
GLUCOSE: 111 mg/dL — AB (ref 65–99)
POTASSIUM: 4.1 mmol/L (ref 3.5–5.1)
Sodium: 140 mmol/L (ref 135–145)

## 2017-05-11 LAB — CBC
HEMATOCRIT: 39.2 % (ref 36.0–46.0)
HEMOGLOBIN: 12.4 g/dL (ref 12.0–15.0)
MCH: 27.6 pg (ref 26.0–34.0)
MCHC: 31.6 g/dL (ref 30.0–36.0)
MCV: 87.3 fL (ref 78.0–100.0)
Platelets: 287 10*3/uL (ref 150–400)
RBC: 4.49 MIL/uL (ref 3.87–5.11)
RDW: 16.1 % — ABNORMAL HIGH (ref 11.5–15.5)
WBC: 9.1 10*3/uL (ref 4.0–10.5)

## 2017-05-11 LAB — TYPE AND SCREEN
ABO/RH(D): A POS
ANTIBODY SCREEN: NEGATIVE

## 2017-05-11 LAB — ABO/RH: ABO/RH(D): A POS

## 2017-05-11 NOTE — Progress Notes (Signed)
PCP - Dr. Gard[facility] Cardiologist - denies  EKG - 05/11/2017   Last dose of Aspirin 05/12/17   Patient denies shortness of breath, fever, cough and chest pain at PAT appointment   Patient verbalized understanding of instructions that were given to them at the PAT appointment. Patient was also instructed that they will need to review over the PAT instructions again at home before surgery.

## 2017-05-19 ENCOUNTER — Inpatient Hospital Stay (HOSPITAL_COMMUNITY): Payer: Medicare HMO

## 2017-05-19 ENCOUNTER — Inpatient Hospital Stay (HOSPITAL_COMMUNITY): Payer: Medicare HMO | Admitting: Certified Registered Nurse Anesthetist

## 2017-05-19 ENCOUNTER — Encounter (HOSPITAL_COMMUNITY)
Admission: RE | Disposition: A | Discharge status: Home / Self Care | Payer: Self-pay | Source: Ambulatory Visit | Attending: Neurosurgery

## 2017-05-19 ENCOUNTER — Other Ambulatory Visit: Payer: Self-pay

## 2017-05-19 ENCOUNTER — Inpatient Hospital Stay (HOSPITAL_COMMUNITY)
Admission: RE | Admit: 2017-05-19 | Discharge: 2017-05-21 | DRG: 455 | Disposition: A | Discharge status: Home / Self Care | Payer: Medicare HMO | Source: Ambulatory Visit | Attending: Neurosurgery | Admitting: Neurosurgery

## 2017-05-19 ENCOUNTER — Encounter (HOSPITAL_COMMUNITY): Payer: Self-pay | Admitting: General Practice

## 2017-05-19 DIAGNOSIS — M48062 Spinal stenosis, lumbar region with neurogenic claudication: Principal | ICD-10-CM | POA: Diagnosis present

## 2017-05-19 DIAGNOSIS — Z961 Presence of intraocular lens: Secondary | ICD-10-CM | POA: Diagnosis present

## 2017-05-19 DIAGNOSIS — M5416 Radiculopathy, lumbar region: Secondary | ICD-10-CM | POA: Diagnosis present

## 2017-05-19 DIAGNOSIS — M4317 Spondylolisthesis, lumbosacral region: Secondary | ICD-10-CM | POA: Diagnosis present

## 2017-05-19 DIAGNOSIS — Z90710 Acquired absence of both cervix and uterus: Secondary | ICD-10-CM

## 2017-05-19 DIAGNOSIS — M4807 Spinal stenosis, lumbosacral region: Secondary | ICD-10-CM | POA: Diagnosis not present

## 2017-05-19 DIAGNOSIS — Z79890 Hormone replacement therapy (postmenopausal): Secondary | ICD-10-CM

## 2017-05-19 DIAGNOSIS — M1990 Unspecified osteoarthritis, unspecified site: Secondary | ICD-10-CM | POA: Diagnosis not present

## 2017-05-19 DIAGNOSIS — E039 Hypothyroidism, unspecified: Secondary | ICD-10-CM | POA: Diagnosis present

## 2017-05-19 DIAGNOSIS — M4316 Spondylolisthesis, lumbar region: Secondary | ICD-10-CM | POA: Diagnosis not present

## 2017-05-19 DIAGNOSIS — Z885 Allergy status to narcotic agent status: Secondary | ICD-10-CM

## 2017-05-19 DIAGNOSIS — K219 Gastro-esophageal reflux disease without esophagitis: Secondary | ICD-10-CM | POA: Diagnosis present

## 2017-05-19 DIAGNOSIS — Z7982 Long term (current) use of aspirin: Secondary | ICD-10-CM

## 2017-05-19 DIAGNOSIS — I10 Essential (primary) hypertension: Secondary | ICD-10-CM | POA: Diagnosis present

## 2017-05-19 DIAGNOSIS — R269 Unspecified abnormalities of gait and mobility: Secondary | ICD-10-CM | POA: Diagnosis not present

## 2017-05-19 DIAGNOSIS — Z419 Encounter for procedure for purposes other than remedying health state, unspecified: Secondary | ICD-10-CM

## 2017-05-19 DIAGNOSIS — Z886 Allergy status to analgesic agent status: Secondary | ICD-10-CM | POA: Diagnosis not present

## 2017-05-19 DIAGNOSIS — Z888 Allergy status to other drugs, medicaments and biological substances status: Secondary | ICD-10-CM

## 2017-05-19 DIAGNOSIS — Z9842 Cataract extraction status, left eye: Secondary | ICD-10-CM | POA: Diagnosis not present

## 2017-05-19 DIAGNOSIS — Z800 Family history of malignant neoplasm of digestive organs: Secondary | ICD-10-CM | POA: Diagnosis not present

## 2017-05-19 DIAGNOSIS — M5116 Intervertebral disc disorders with radiculopathy, lumbar region: Secondary | ICD-10-CM | POA: Diagnosis not present

## 2017-05-19 DIAGNOSIS — Z9841 Cataract extraction status, right eye: Secondary | ICD-10-CM

## 2017-05-19 DIAGNOSIS — M48061 Spinal stenosis, lumbar region without neurogenic claudication: Secondary | ICD-10-CM | POA: Diagnosis not present

## 2017-05-19 DIAGNOSIS — Z86711 Personal history of pulmonary embolism: Secondary | ICD-10-CM | POA: Diagnosis not present

## 2017-05-19 DIAGNOSIS — M4326 Fusion of spine, lumbar region: Secondary | ICD-10-CM | POA: Diagnosis not present

## 2017-05-19 SURGERY — POSTERIOR LUMBAR FUSION 2 LEVEL
Anesthesia: General

## 2017-05-19 MED ORDER — PHENYLEPHRINE 40 MCG/ML (10ML) SYRINGE FOR IV PUSH (FOR BLOOD PRESSURE SUPPORT)
PREFILLED_SYRINGE | INTRAVENOUS | Status: AC
  Filled 2017-05-19: qty 20

## 2017-05-19 MED ORDER — VANCOMYCIN HCL 1 G IV SOLR
INTRAVENOUS | Status: DC | PRN
  Administered 2017-05-19: 1000 mg

## 2017-05-19 MED ORDER — 0.9 % SODIUM CHLORIDE (POUR BTL) OPTIME
TOPICAL | Status: DC | PRN
  Administered 2017-05-19: 1000 mL

## 2017-05-19 MED ORDER — ZOLPIDEM TARTRATE 5 MG PO TABS: 5.0000 mg | ORAL_TABLET | Freq: Every evening | ORAL | Status: DC | PRN

## 2017-05-19 MED ORDER — CYCLOBENZAPRINE HCL 10 MG PO TABS
5.0000 mg | ORAL_TABLET | Freq: Three times a day (TID) | ORAL | Status: DC | PRN
  Administered 2017-05-19: 5 mg via ORAL
  Filled 2017-05-19: qty 1

## 2017-05-19 MED ORDER — BUPIVACAINE LIPOSOME 1.3 % IJ SUSP
20.0000 mL | Freq: Once | INTRAMUSCULAR | Status: DC
  Filled 2017-05-19: qty 20

## 2017-05-19 MED ORDER — OXYCODONE HCL 5 MG/5ML PO SOLN: 5.0000 mg | Freq: Once | ORAL | Status: DC | PRN

## 2017-05-19 MED ORDER — THROMBIN 20000 UNITS EX SOLR
CUTANEOUS | Status: AC
  Filled 2017-05-19: qty 20000

## 2017-05-19 MED ORDER — DOCUSATE SODIUM 100 MG PO CAPS
100.0000 mg | ORAL_CAPSULE | Freq: Two times a day (BID) | ORAL | Status: DC
  Administered 2017-05-19 – 2017-05-21 (×4): 100 mg via ORAL
  Filled 2017-05-19 (×5): qty 1

## 2017-05-19 MED ORDER — LIDOCAINE 2% (20 MG/ML) 5 ML SYRINGE
INTRAMUSCULAR | Status: DC | PRN
Start: 2017-05-19 — End: 2017-05-19
  Administered 2017-05-19: 80 mg via INTRAVENOUS

## 2017-05-19 MED ORDER — SODIUM CHLORIDE 0.9 % IV SOLN
250.0000 mL | INTRAVENOUS | Status: DC
  Administered 2017-05-19: 250 mL via INTRAVENOUS

## 2017-05-19 MED ORDER — CEFAZOLIN SODIUM-DEXTROSE 2-3 GM-%(50ML) IV SOLR
INTRAVENOUS | Status: DC | PRN
  Administered 2017-05-19: 2 g via INTRAVENOUS

## 2017-05-19 MED ORDER — CHLORHEXIDINE GLUCONATE CLOTH 2 % EX PADS: 6.0000 | MEDICATED_PAD | Freq: Once | CUTANEOUS | Status: DC

## 2017-05-19 MED ORDER — MORPHINE SULFATE (PF) 4 MG/ML IV SOLN: 4.0000 mg | INTRAVENOUS | Status: DC | PRN

## 2017-05-19 MED ORDER — ACETAMINOPHEN 500 MG PO TABS
1000.0000 mg | ORAL_TABLET | Freq: Four times a day (QID) | ORAL | Status: AC
  Administered 2017-05-19 – 2017-05-20 (×4): 1000 mg via ORAL
  Filled 2017-05-19 (×4): qty 2

## 2017-05-19 MED ORDER — FENTANYL CITRATE (PF) 100 MCG/2ML IJ SOLN: 25.0000 ug | INTRAMUSCULAR | Status: DC | PRN

## 2017-05-19 MED ORDER — SCOPOLAMINE 1 MG/3DAYS TD PT72
MEDICATED_PATCH | TRANSDERMAL | Status: AC
  Administered 2017-05-19: 1.5 mg via TRANSDERMAL
  Filled 2017-05-19: qty 1

## 2017-05-19 MED ORDER — ONDANSETRON HCL 4 MG PO TABS: 4.0000 mg | ORAL_TABLET | Freq: Four times a day (QID) | ORAL | Status: DC | PRN

## 2017-05-19 MED ORDER — VANCOMYCIN HCL 1000 MG IV SOLR
INTRAVENOUS | Status: AC
  Filled 2017-05-19: qty 1000

## 2017-05-19 MED ORDER — FLUTICASONE PROPIONATE 50 MCG/ACT NA SUSP
1.0000 | Freq: Every day | NASAL | Status: DC | PRN
  Filled 2017-05-19: qty 16

## 2017-05-19 MED ORDER — BACITRACIN ZINC 500 UNIT/GM EX OINT
TOPICAL_OINTMENT | CUTANEOUS | Status: DC | PRN
  Administered 2017-05-19: 1 via TOPICAL

## 2017-05-19 MED ORDER — ONDANSETRON HCL 4 MG/2ML IJ SOLN
INTRAMUSCULAR | Status: AC
  Filled 2017-05-19: qty 2

## 2017-05-19 MED ORDER — THROMBIN 5000 UNITS EX SOLR
CUTANEOUS | Status: AC
  Filled 2017-05-19: qty 5000

## 2017-05-19 MED ORDER — ROCURONIUM BROMIDE 10 MG/ML (PF) SYRINGE
PREFILLED_SYRINGE | INTRAVENOUS | Status: AC
  Filled 2017-05-19: qty 5

## 2017-05-19 MED ORDER — SODIUM CHLORIDE 0.9% FLUSH: 3.0000 mL | INTRAVENOUS | Status: DC | PRN

## 2017-05-19 MED ORDER — PHENYLEPHRINE HCL 10 MG/ML IJ SOLN
INTRAVENOUS | Status: DC | PRN
  Administered 2017-05-19: 10 ug/min via INTRAVENOUS

## 2017-05-19 MED ORDER — MENTHOL 3 MG MT LOZG: 1.0000 | LOZENGE | OROMUCOSAL | Status: DC | PRN

## 2017-05-19 MED ORDER — LEVOTHYROXINE SODIUM 75 MCG PO TABS
75.0000 ug | ORAL_TABLET | Freq: Every day | ORAL | Status: DC
  Administered 2017-05-20 – 2017-05-21 (×2): 75 ug via ORAL
  Filled 2017-05-19 (×2): qty 1

## 2017-05-19 MED ORDER — SCOPOLAMINE 1 MG/3DAYS TD PT72: 1.0000 | MEDICATED_PATCH | TRANSDERMAL | Status: DC

## 2017-05-19 MED ORDER — ARTIFICIAL TEARS OPHTHALMIC OINT
TOPICAL_OINTMENT | OPHTHALMIC | Status: DC | PRN
  Administered 2017-05-19: 1 via OPHTHALMIC

## 2017-05-19 MED ORDER — FENTANYL CITRATE (PF) 250 MCG/5ML IJ SOLN
INTRAMUSCULAR | Status: AC
  Filled 2017-05-19: qty 5

## 2017-05-19 MED ORDER — PHENOL 1.4 % MT LIQD: 1.0000 | OROMUCOSAL | Status: DC | PRN

## 2017-05-19 MED ORDER — BUPIVACAINE-EPINEPHRINE (PF) 0.5% -1:200000 IJ SOLN
INTRAMUSCULAR | Status: AC
  Filled 2017-05-19: qty 30

## 2017-05-19 MED ORDER — ROCURONIUM BROMIDE 100 MG/10ML IV SOLN
INTRAVENOUS | Status: DC | PRN
  Administered 2017-05-19 (×3): 10 mg via INTRAVENOUS
  Administered 2017-05-19: 60 mg via INTRAVENOUS
  Administered 2017-05-19: 10 mg via INTRAVENOUS

## 2017-05-19 MED ORDER — PHENYLEPHRINE 40 MCG/ML (10ML) SYRINGE FOR IV PUSH (FOR BLOOD PRESSURE SUPPORT)
PREFILLED_SYRINGE | INTRAVENOUS | Status: DC | PRN
  Administered 2017-05-19: 40 ug via INTRAVENOUS
  Administered 2017-05-19: 120 ug via INTRAVENOUS

## 2017-05-19 MED ORDER — METOPROLOL SUCCINATE ER 25 MG PO TB24
25.0000 mg | ORAL_TABLET | Freq: Every day | ORAL | Status: DC
  Administered 2017-05-20 – 2017-05-21 (×2): 25 mg via ORAL
  Filled 2017-05-19 (×2): qty 1

## 2017-05-19 MED ORDER — LORATADINE 10 MG PO TABS
10.0000 mg | ORAL_TABLET | Freq: Every day | ORAL | Status: DC
  Administered 2017-05-19 – 2017-05-21 (×3): 10 mg via ORAL
  Filled 2017-05-19 (×3): qty 1

## 2017-05-19 MED ORDER — OXYCODONE HCL 5 MG PO TABS: 5.0000 mg | ORAL_TABLET | Freq: Once | ORAL | Status: DC | PRN

## 2017-05-19 MED ORDER — SODIUM CHLORIDE 0.9 % IR SOLN
Status: DC | PRN
  Administered 2017-05-19 (×2)

## 2017-05-19 MED ORDER — PROPOFOL 10 MG/ML IV BOLUS
INTRAVENOUS | Status: DC | PRN
  Administered 2017-05-19: 170 mg via INTRAVENOUS

## 2017-05-19 MED ORDER — SODIUM CHLORIDE 0.9% FLUSH
3.0000 mL | Freq: Two times a day (BID) | INTRAVENOUS | Status: DC
  Administered 2017-05-19 – 2017-05-21 (×4): 3 mL via INTRAVENOUS

## 2017-05-19 MED ORDER — LACTATED RINGERS IV SOLN
INTRAVENOUS | Status: DC
  Administered 2017-05-19 (×2): via INTRAVENOUS

## 2017-05-19 MED ORDER — ACETAMINOPHEN 325 MG PO TABS
650.0000 mg | ORAL_TABLET | ORAL | Status: DC | PRN
  Administered 2017-05-20 – 2017-05-21 (×2): 650 mg via ORAL
  Filled 2017-05-19 (×2): qty 2

## 2017-05-19 MED ORDER — ACETAMINOPHEN 650 MG RE SUPP: 650.0000 mg | RECTAL | Status: DC | PRN

## 2017-05-19 MED ORDER — FENTANYL CITRATE (PF) 100 MCG/2ML IJ SOLN
INTRAMUSCULAR | Status: DC | PRN
  Administered 2017-05-19 (×3): 50 ug via INTRAVENOUS
  Administered 2017-05-19: 100 ug via INTRAVENOUS

## 2017-05-19 MED ORDER — THROMBIN (RECOMBINANT) 5000 UNITS EX SOLR
OROMUCOSAL | Status: DC | PRN
  Administered 2017-05-19: 10:00:00 via TOPICAL

## 2017-05-19 MED ORDER — BISACODYL 10 MG RE SUPP: 10.0000 mg | Freq: Every day | RECTAL | Status: DC | PRN

## 2017-05-19 MED ORDER — BUPIVACAINE-EPINEPHRINE (PF) 0.5% -1:200000 IJ SOLN
INTRAMUSCULAR | Status: DC | PRN
  Administered 2017-05-19: 10 mL via PERINEURAL

## 2017-05-19 MED ORDER — LIDOCAINE HCL (CARDIAC) 20 MG/ML IV SOLN
INTRAVENOUS | Status: AC
Start: 2017-05-19 — End: 2017-05-19
  Filled 2017-05-19: qty 5

## 2017-05-19 MED ORDER — ONDANSETRON HCL 4 MG/2ML IJ SOLN: 4.0000 mg | Freq: Four times a day (QID) | INTRAMUSCULAR | Status: DC | PRN

## 2017-05-19 MED ORDER — DEXAMETHASONE SODIUM PHOSPHATE 10 MG/ML IJ SOLN
INTRAMUSCULAR | Status: DC | PRN
  Administered 2017-05-19: 10 mg via INTRAVENOUS

## 2017-05-19 MED ORDER — AMLODIPINE BESYLATE 5 MG PO TABS
5.0000 mg | ORAL_TABLET | Freq: Every day | ORAL | Status: DC
  Administered 2017-05-20 – 2017-05-21 (×2): 5 mg via ORAL
  Filled 2017-05-19 (×2): qty 1

## 2017-05-19 MED ORDER — SUGAMMADEX SODIUM 200 MG/2ML IV SOLN
INTRAVENOUS | Status: DC | PRN
  Administered 2017-05-19: 170 mg via INTRAVENOUS

## 2017-05-19 MED ORDER — ONDANSETRON HCL 4 MG/2ML IJ SOLN
INTRAMUSCULAR | Status: DC | PRN
  Administered 2017-05-19: 4 mg via INTRAVENOUS

## 2017-05-19 MED ORDER — CEFAZOLIN SODIUM-DEXTROSE 2-4 GM/100ML-% IV SOLN
2.0000 g | Freq: Three times a day (TID) | INTRAVENOUS | Status: AC
  Administered 2017-05-19 – 2017-05-20 (×2): 2 g via INTRAVENOUS
  Filled 2017-05-19 (×2): qty 100

## 2017-05-19 MED ORDER — SCOPOLAMINE 1 MG/3DAYS TD PT72
1.0000 | MEDICATED_PATCH | TRANSDERMAL | Status: DC
  Administered 2017-05-19: 1.5 mg via TRANSDERMAL

## 2017-05-19 MED ORDER — ONDANSETRON HCL 4 MG/2ML IJ SOLN
4.0000 mg | Freq: Once | INTRAMUSCULAR | Status: AC | PRN
  Administered 2017-05-19: 4 mg via INTRAVENOUS

## 2017-05-19 MED ORDER — EZETIMIBE 10 MG PO TABS
10.0000 mg | ORAL_TABLET | Freq: Every day | ORAL | Status: DC
  Administered 2017-05-19 – 2017-05-20 (×2): 10 mg via ORAL
  Filled 2017-05-19 (×2): qty 1

## 2017-05-19 MED ORDER — CALCIUM CARB-CHOLECALCIFEROL 600-800 MG-UNIT PO TABS: 1.0000 | ORAL_TABLET | Freq: Two times a day (BID) | ORAL | Status: DC

## 2017-05-19 MED ORDER — CEFAZOLIN SODIUM-DEXTROSE 2-4 GM/100ML-% IV SOLN
2.0000 g | INTRAVENOUS | Status: DC
  Filled 2017-05-19: qty 100

## 2017-05-19 MED ORDER — PANTOPRAZOLE SODIUM 40 MG PO TBEC: 40.0000 mg | DELAYED_RELEASE_TABLET | Freq: Every day | ORAL | Status: DC | PRN

## 2017-05-19 MED ORDER — BACITRACIN ZINC 500 UNIT/GM EX OINT
TOPICAL_OINTMENT | CUTANEOUS | Status: AC
  Filled 2017-05-19: qty 28.35

## 2017-05-19 MED ORDER — DEXAMETHASONE SODIUM PHOSPHATE 10 MG/ML IJ SOLN
INTRAMUSCULAR | Status: AC
  Filled 2017-05-19: qty 1

## 2017-05-19 MED ORDER — HYDROMORPHONE HCL 2 MG PO TABS: 2.0000 mg | ORAL_TABLET | ORAL | Status: DC | PRN

## 2017-05-19 MED ORDER — BUPIVACAINE LIPOSOME 1.3 % IJ SUSP
INTRAMUSCULAR | Status: DC | PRN
  Administered 2017-05-19: 20 mL

## 2017-05-19 SURGICAL SUPPLY — 74 items
APL SKNCLS STERI-STRIP NONHPOA (GAUZE/BANDAGES/DRESSINGS) ×1
BAG DECANTER FOR FLEXI CONT (MISCELLANEOUS) ×3 IMPLANT
BASKET BONE COLLECTION (BASKET) ×1 IMPLANT
BENZOIN TINCTURE PRP APPL 2/3 (GAUZE/BANDAGES/DRESSINGS) ×2 IMPLANT
BLADE CLIPPER SURG (BLADE) IMPLANT
BUR MATCHSTICK NEURO 3.0 LAGG (BURR) ×2 IMPLANT
BUR PRECISION FLUTE 6.0 (BURR) ×2 IMPLANT
CAGE ALTERA 10X31X9-13 15D (Cage) ×1 IMPLANT
CANISTER SUCT 3000ML PPV (MISCELLANEOUS) ×2 IMPLANT
CAP REVERE LOCKING (Cap) ×6 IMPLANT
CARTRIDGE OIL MAESTRO DRILL (MISCELLANEOUS) ×1 IMPLANT
CONT SPEC 4OZ CLIKSEAL STRL BL (MISCELLANEOUS) ×2 IMPLANT
COVER BACK TABLE 60X90IN (DRAPES) ×4 IMPLANT
DECANTER SPIKE VIAL GLASS SM (MISCELLANEOUS) ×2 IMPLANT
DIFFUSER DRILL AIR PNEUMATIC (MISCELLANEOUS) ×2 IMPLANT
DRAPE C-ARM 42X72 X-RAY (DRAPES) ×4 IMPLANT
DRAPE HALF SHEET 40X57 (DRAPES) ×3 IMPLANT
DRAPE LAPAROTOMY 100X72X124 (DRAPES) ×2 IMPLANT
DRAPE SURG 17X23 STRL (DRAPES) ×8 IMPLANT
DRSG OPSITE POSTOP 4X8 (GAUZE/BANDAGES/DRESSINGS) ×1 IMPLANT
ELECT BLADE 4.0 EZ CLEAN MEGAD (MISCELLANEOUS) ×2
ELECT REM PT RETURN 9FT ADLT (ELECTROSURGICAL) ×2
ELECTRODE BLDE 4.0 EZ CLN MEGD (MISCELLANEOUS) ×1 IMPLANT
ELECTRODE REM PT RTRN 9FT ADLT (ELECTROSURGICAL) ×1 IMPLANT
GAUZE SPONGE 4X4 12PLY STRL (GAUZE/BANDAGES/DRESSINGS) ×2 IMPLANT
GAUZE SPONGE 4X4 16PLY XRAY LF (GAUZE/BANDAGES/DRESSINGS) ×2 IMPLANT
GLOVE BIO SURGEON STRL SZ8 (GLOVE) ×5 IMPLANT
GLOVE BIO SURGEON STRL SZ8.5 (GLOVE) ×4 IMPLANT
GLOVE BIOGEL PI IND STRL 6.5 (GLOVE) IMPLANT
GLOVE BIOGEL PI IND STRL 7.0 (GLOVE) IMPLANT
GLOVE BIOGEL PI IND STRL 8 (GLOVE) IMPLANT
GLOVE BIOGEL PI INDICATOR 6.5 (GLOVE) ×3
GLOVE BIOGEL PI INDICATOR 7.0 (GLOVE) ×4
GLOVE BIOGEL PI INDICATOR 8 (GLOVE) ×3
GLOVE ECLIPSE 7.5 STRL STRAW (GLOVE) ×1 IMPLANT
GLOVE EXAM NITRILE LRG STRL (GLOVE) IMPLANT
GLOVE EXAM NITRILE XL STR (GLOVE) IMPLANT
GLOVE EXAM NITRILE XS STR PU (GLOVE) IMPLANT
GLOVE SURG SS PI 6.0 STRL IVOR (GLOVE) ×3 IMPLANT
GLOVE SURG SS PI 6.5 STRL IVOR (GLOVE) ×2 IMPLANT
GOWN STRL REUS W/ TWL LRG LVL3 (GOWN DISPOSABLE) IMPLANT
GOWN STRL REUS W/ TWL XL LVL3 (GOWN DISPOSABLE) ×2 IMPLANT
GOWN STRL REUS W/TWL 2XL LVL3 (GOWN DISPOSABLE) IMPLANT
GOWN STRL REUS W/TWL LRG LVL3 (GOWN DISPOSABLE) ×6
GOWN STRL REUS W/TWL XL LVL3 (GOWN DISPOSABLE) ×8
HEMOSTAT POWDER KIT SURGIFOAM (HEMOSTASIS) ×2 IMPLANT
KIT BASIN OR (CUSTOM PROCEDURE TRAY) ×2 IMPLANT
KIT TURNOVER KIT B (KITS) ×2 IMPLANT
MILL MEDIUM DISP (BLADE) ×1 IMPLANT
NDL HYPO 21X1.5 SAFETY (NEEDLE) IMPLANT
NEEDLE HYPO 21X1.5 SAFETY (NEEDLE) IMPLANT
NEEDLE HYPO 22GX1.5 SAFETY (NEEDLE) ×2 IMPLANT
NS IRRIG 1000ML POUR BTL (IV SOLUTION) ×2 IMPLANT
OIL CARTRIDGE MAESTRO DRILL (MISCELLANEOUS) ×2
PACK LAMINECTOMY NEURO (CUSTOM PROCEDURE TRAY) ×2 IMPLANT
PAD ARMBOARD 7.5X6 YLW CONV (MISCELLANEOUS) ×6 IMPLANT
PATTIES SURGICAL .5 X1 (DISPOSABLE) IMPLANT
ROD REVERE CURVED 65MM (Rod) ×2 IMPLANT
SCREW 7.5X45MM (Screw) ×2 IMPLANT
SCREW 7.5X50MM (Screw) ×4 IMPLANT
SPACER ALTERA 10X31 8-12MM-8 (Spacer) ×1 IMPLANT
SPONGE LAP 4X18 X RAY DECT (DISPOSABLE) IMPLANT
SPONGE NEURO XRAY DETECT 1X3 (DISPOSABLE) IMPLANT
SPONGE SURGIFOAM ABS GEL 100 (HEMOSTASIS) ×1 IMPLANT
STRIP BIOACTIVE 20CC 25X100X8 (Miscellaneous) ×2 IMPLANT
STRIP CLOSURE SKIN 1/2X4 (GAUZE/BANDAGES/DRESSINGS) ×2 IMPLANT
SUT VIC AB 1 CT1 18XBRD ANBCTR (SUTURE) ×2 IMPLANT
SUT VIC AB 1 CT1 8-18 (SUTURE) ×4
SUT VIC AB 2-0 CP2 18 (SUTURE) ×4 IMPLANT
SYR 20CC LL (SYRINGE) ×1 IMPLANT
TOWEL GREEN STERILE (TOWEL DISPOSABLE) ×2 IMPLANT
TOWEL GREEN STERILE FF (TOWEL DISPOSABLE) ×2 IMPLANT
TRAY FOLEY W/METER SILVER 16FR (SET/KITS/TRAYS/PACK) ×2 IMPLANT
WATER STERILE IRR 1000ML POUR (IV SOLUTION) ×2 IMPLANT

## 2017-05-19 NOTE — Op Note (Signed)
Brief history: The patient is an 84 year old white female who has complained of back and leg pain consistent with neurogenic claudication.  She has failed medical management and was worked up with lumbar x-rays and a lumbar MRI which demonstrated an L4-5 and L5-S1 spondylolisthesis with spinal stenosis.  I discussed the various treatment options with the patient.  She has weighed the risks, benefits, alternatives and decided to proceed with a L4-5 and L5-S1 decompression, instrumentation, and fusion.  Preoperative diagnosis: L4-5 and L5-S1 spondylolisthesis, facet arthropathy degenerative disc disease, spinal stenosis compressing the L4, L5 and S1 nerve roots; lumbago; lumbar radiculopathy; Neurogenic claudication  Postoperative diagnosis: The same  Procedure: A little L4-5 and L5-S1 laminotomy/foraminotomies to decompress the bilateral L4, L5 and S1 nerve roots(the work required to do this was in addition to the work required to do the posterior lumbar interbody fusion because of the patient's spinal stenosis, facet arthropathy. Etc. requiring a wide decompression of the nerve roots.);  4 5 and L5-S1 transforaminal lumbar interbody fusion with local morselized autograft bone and Kinnex graft extender; insertion of interbody prosthesis at L4-5 and L5-S1 (globus peek expandable interbody prosthesis); posterior segmental instrumentation from L4 to S1 with globus titanium pedicle screws and rods; posterior lateral arthrodesis at L4-5 and L5-S1 bilaterally with local morselized autograft bone and Kinnex bone graft extender.  Surgeon: Dr. Earle Gell  Asst.: Dr. Sherwood Gambler  Anesthesia: Gen. endotracheal  Estimated blood loss: 300 cc  Drains: None  Complications: None  Description of procedure: The patient was brought to the operating room by the anesthesia team. General endotracheal anesthesia was induced. The patient was turned to the prone position on the Wilson frame. The patient's lumbosacral  region was then prepared with Betadine scrub and Betadine solution. Sterile drapes were applied.  I then injected the area to be incised with Marcaine with epinephrine solution. I then used the scalpel to make a linear midline incision over the L4-5 and L5-S1 interspace. I then used electrocautery to perform a bilateral subperiosteal dissection exposing the spinous process and lamina of L4, L5 and the upper sacrum. We then obtained intraoperative radiograph to confirm our location. We then inserted the Verstrac retractor to provide exposure.  I began the decompression by using the high speed drill to perform laminotomies at L4-5 and L5-S1 bilaterally. We then used the Kerrison punches to widen the laminotomy and removed the ligamentum flavum at L4-5 and L5-S1 bilaterally. We used the Kerrison punches to remove the medial facets at L4-5 and L5-S1 bilaterally. We performed wide foraminotomies about the bilateral L4, L5 and S1 nerve roots completing the decompression.  We now turned our attention to the posterior lumbar interbody fusion. I used a scalpel to incise the intervertebral disc at L4-5 and L5-S1 bilaterally. I then performed a partial intervertebral discectomy at L4-5 and L5-S1 bilaterally using the pituitary forceps. We prepared the vertebral endplates at Y1-9 and J0-D3 bilaterally for the fusion by removing the soft tissues with the curettes. We then used the trial spacers to pick the appropriate sized interbody prosthesis. We prefilled his prosthesis with a combination of local morselized autograft bone that we obtained during the decompression as well as Kinnex bone graft extender. We inserted the prefilled prosthesis into the interspace at L4-5 and L5-S1.  We expanded the prosthesis at both levels. There was a good snug fit of the prosthesis in the interspace. We then filled and the remainder of the intervertebral disc space with local morselized autograft bone and Kinnex. This completed the  posterior lumbar interbody arthrodesis.  We now turned attention to the instrumentation. Under fluoroscopic guidance we cannulated the bilateral L4, L5 and S1 pedicles with the bone probe. We then removed the bone probe. We then tapped the pedicle with a 6.5 millimeter tap. We then removed the tap. We probed inside the tapped pedicle with a ball probe to rule out cortical breaches. We then inserted a 7.5 x 50 and 45 millimeter pedicle screw into the L4, L5 and S1 pedicles bilaterally under fluoroscopic guidance. We then palpated along the medial aspect of the pedicles to rule out cortical breaches. There were none. The nerve roots were not injured. We then connected the unilateral pedicle screws with a lordotic rod. We compressed the construct and secured the rod in place with the caps. We then tightened the caps appropriately. This completed the instrumentation from L4-S1 bilaterally.  We now turned our attention to the posterior lateral arthrodesis at L4-5 and L5-S1 bilaterally. We used the high-speed drill to decorticate the remainder of the facets, pars, transverse process at L4-5 and L5-S1 bilaterally. We then applied a combination of local morselized autograft bone and Kinnex bone graft extender over these decorticated posterior lateral structures. This completed the posterior lateral arthrodesis.  We then obtained hemostasis using bipolar electrocautery. We irrigated the wound out with bacitracin solution. We inspected the thecal sac and nerve roots and noted they were well decompressed. We then removed the retractor. We placed vancomycin powder in the wound. We reapproximated patient's thoracolumbar fascia with interrupted #1 Vicryl suture. We reapproximated patient's subcutaneous tissue with interrupted 2-0 Vicryl suture. The reapproximated patient's skin with Steri-Strips and benzoin. The wound was then coated with bacitracin ointment. A sterile dressing was applied. The drapes were removed. The  patient was subsequently returned to the supine position where they were extubated by the anesthesia team. He was then transported to the post anesthesia care unit in stable condition. All sponge instrument and needle counts were reportedly correct at the end of this case.

## 2017-05-19 NOTE — Anesthesia Postprocedure Evaluation (Signed)
Anesthesia Post Note  Patient: Debbie Taylor  Procedure(s) Performed: POSTERIOR LUMBAR INTERBODY FUSION, INTERBODY PROSTHESIS, POSTERIOR INSTRAMENTATION AND FUSION , LUMBAR FOUR- LUMBAR FIVE, LUMBAR FIVE- SACRAL ONE (N/A )     Anesthesia Post Evaluation  Last Vitals:  Vitals:   05/19/17 1600 05/19/17 1615  BP: (!) 123/52 (!) 128/55  Pulse: 68 70  Resp: 20 19  Temp:    SpO2: 96% 96%    Last Pain:  Vitals:   05/19/17 1615  TempSrc:   PainSc: 0-No pain                 Audry Pili

## 2017-05-19 NOTE — Transfer of Care (Signed)
Immediate Anesthesia Transfer of Care Note  Patient: Debbie Taylor  Procedure(s) Performed: POSTERIOR LUMBAR INTERBODY FUSION, INTERBODY PROSTHESIS, POSTERIOR INSTRAMENTATION AND FUSION , LUMBAR FOUR- LUMBAR FIVE, LUMBAR FIVE- SACRAL ONE (N/A )  Patient Location: PACU  Anesthesia Type:General  Level of Consciousness: drowsy  Airway & Oxygen Therapy: Patient Spontanous Breathing and Patient connected to nasal cannula oxygen  Post-op Assessment: Report given to RN, Post -op Vital signs reviewed and stable and Patient moving all extremities  Post vital signs: Reviewed and stable  Last Vitals:  Vitals Value Taken Time  BP 148/73 05/19/2017  1:44 PM  Temp    Pulse 83 05/19/2017  1:46 PM  Resp 10 05/19/2017  1:46 PM  SpO2 93 % 05/19/2017  1:46 PM  Vitals shown include unvalidated device data.  Last Pain:  Vitals:   05/19/17 0724  TempSrc:   PainSc: 0-No pain      Patients Stated Pain Goal: 3 (95/32/02 3343)  Complications: No apparent anesthesia complications

## 2017-05-19 NOTE — H&P (Signed)
Subjective: The patient is an 84 year old white female who has complained of back, buttock and leg pain consistent with neurogenic claudication.  She failed medical management and was worked up with lumbar x-rays and a lumbar MRI.  This demonstrated an L4-5 and L5-S1 spondylolisthesis, spinal stenosis, etc.  I discussed the various treatment options with the patient.  She has decided to proceed with surgery.  Past Medical History:  Diagnosis Date  . Arthritis   . GERD (gastroesophageal reflux disease)   . Hypertension   . Hypothyroidism   . PONV (postoperative nausea and vomiting)   . Pulmonary embolism ([place])    after hysterectomy in 1988  . Thyroid disease     Past Surgical History:  Procedure Laterality Date  . ABDOMINAL HYSTERECTOMY    . BREAST EXCISIONAL BIOPSY Right 10+ years   benign  . BREAST SURGERY Right 1961  . BREAST SURGERY Left 1996   tumor  . CATARACT EXTRACTION W/ INTRAOCULAR LENS IMPLANT Bilateral   . COLONOSCOPY    . GANGLION CYST EXCISION      Allergies  Allergen Reactions  . Codeine Hives and Itching  . Statins     Muscle pain  . Celebrex [Celecoxib] Palpitations    Social History   Tobacco Use  . Smoking status: Never Smoker  . Smokeless tobacco: Never Used  Substance Use Topics  . Alcohol use: Yes    Comment: glass of wine occasionally    Family History  Problem Relation Age of Onset  . Cancer - Other Mother        duodenum cancer  . Colon cancer Neg Hx    Prior to Admission medications   Medication Sig Start Date End Date Taking? Authorizing Provider  amLODipine (NORVASC) 5 MG tablet Take 5 mg by mouth daily.   Yes [provider]  amoxicillin-clavulanate (AUGMENTIN) 875-125 MG tablet Take 1 tablet by mouth 2 (two) times daily.   Yes [provider]  aspirin 81 MG tablet Take 81 mg by mouth daily.   Yes [provider]  Calcium Carb-Cholecalciferol (CALCIUM 600+D) 600-800 MG-UNIT TABS Take 1 tablet by mouth 2 (two)  times daily.   Yes [provider]  ezetimibe (ZETIA) 10 MG tablet Take 10 mg by mouth daily.   Yes [provider]  fexofenadine (ALLEGRA) 180 MG tablet Take 180 mg by mouth daily.   Yes [provider]  levothyroxine (SYNTHROID, LEVOTHROID) 75 MCG tablet Take 75 mcg by mouth daily before breakfast.   Yes [provider]  metoprolol succinate (TOPROL-XL) 25 MG 24 hr tablet Take 25 mg by mouth daily.   Yes [provider]  mometasone (NASONEX) 50 MCG/ACT nasal spray Place 2 sprays into the nose daily as needed (congestion).   Yes [provider]  Omega-3 Fatty Acids (FISH OIL) 1000 MG CAPS Take 1,000 mg by mouth daily.    Yes [provider]  pantoprazole (PROTONIX) 40 MG tablet Take 40 mg by mouth daily as needed (acid reflux).    Yes [provider]  Probiotic Product (PROBIOTIC DAILY PO) Take 1 capsule by mouth daily.   Yes [provider]  acetaminophen (TYLENOL) 500 MG tablet Take 1,000 mg by mouth every 6 (six) hours as needed for moderate pain or headache.    [provider]     Review of Systems  Positive ROS: As above  All other systems have been reviewed and were otherwise negative with the exception of those mentioned in the HPI  and as above.  Objective: Vital signs in last 24 hours: Temp:  [97.8 F (36.6 C)] 97.8 F (36.6 C) (03/28 0641) Pulse Rate:  [81] 81 (03/28 0641) Resp:  [20] 20 (03/28 0641) BP: (160)/(75) 160/75 (03/28 0641) SpO2:  [99 %] 99 % (03/28 0641) Weight:  [85.3 kg (188 lb)] 85.3 kg (188 lb) (03/28 0641) Estimated body mass index is 30.81 kg/m as calculated from the following:   Height as of this encounter: 5' 5.5" (1.664 m).   Weight as of this encounter: 85.3 kg (188 lb).   General Appearance: Alert Head: Normocephalic, without obvious abnormality, atraumatic Eyes: PERRL, conjunctiva/corneas clear, EOM's intact,    Ears: Normal  Throat: Normal  Neck:  Supple, Back: unremarkable Lungs: Clear to auscultation bilaterally, respirations unlabored Heart: Regular rate and rhythm, no murmur, rub or gallop Abdomen: Soft, non-tender Extremities: Extremities normal, atraumatic, no cyanosis or edema Skin: unremarkable  NEUROLOGIC:   Mental status: alert and oriented,Motor Exam - grossly normal Sensory Exam - grossly normal Reflexes:  Coordination - grossly normal Gait - grossly normal Balance - grossly normal Cranial Nerves: I: smell Not tested  II: visual acuity  OS: Normal  OD: Normal   II: visual fields Full to confrontation  II: pupils Equal, round, reactive to light  III,VII: ptosis None  III,IV,VI: extraocular muscles  Full ROM  V: mastication Normal  V: facial light touch sensation  Normal  V,VII: corneal reflex  Present  VII: facial muscle function - upper  Normal  VII: facial muscle function - lower Normal  VIII: hearing Not tested  IX: soft palate elevation  Normal  IX,X: gag reflex Present  XI: trapezius strength  5/5  XI: sternocleidomastoid strength 5/5  XI: neck flexion strength  5/5  XII: tongue strength  Normal    Data Review Lab Results  Component Value Date   WBC 9.1 05/11/2017   HGB 12.4 05/11/2017   HCT 39.2 05/11/2017   MCV 87.3 05/11/2017   PLT 287 05/11/2017   Lab Results  Component Value Date   NA 140 05/11/2017   K 4.1 05/11/2017   CL 107 05/11/2017   CO2 26 05/11/2017   BUN 24 (H) 05/11/2017   CREATININE 0.87 05/11/2017   GLUCOSE 111 (H) 05/11/2017   No results found for: INR, PROTIME  Assessment/Plan: L4-5 and L5-S1 spondylolisthesis, spinal stenosis, lumbago, lumbar radiculopathy, neurogenic claudication: I have discussed the situation with the patient and reviewed her imaging studies with her.  We have discussed the various treatment options including surgery.  I have described the surgical treatment option of an L4-5 and L5-S1 decompression, instrumentation, and fusion.  I have shown her  surgical models.  I have given her a surgical pamphlet.  We have discussed the risks, benefits, alternatives, expected postoperative course, and likelihood of achieving our goals with surgery.  I have answered all the patient's questions.  She has decided to proceed with surgery.   Ophelia Charter 05/19/2017 8:53 AM

## 2017-05-19 NOTE — Anesthesia Preprocedure Evaluation (Addendum)
Anesthesia Evaluation  Patient identified by MRN, date of birth, ID band Patient awake    Reviewed: Allergy & Precautions, NPO status , Patient's Chart, lab work & pertinent test results, reviewed documented beta blocker date and time   History of Anesthesia Complications (+) PONV  Airway Mallampati: I  TM Distance: >3 FB Neck ROM: Full    Dental  (+) Dental Advisory Given, Teeth Intact   Pulmonary PE   Pulmonary exam normal breath sounds clear to auscultation       Cardiovascular hypertension, Pt. on medications and Pt. on home beta blockers Normal cardiovascular exam Rhythm:Regular Rate:Normal     Neuro/Psych negative neurological ROS  negative psych ROS   GI/Hepatic Neg liver ROS, GERD  Controlled and Medicated,  Endo/Other  Hypothyroidism Obesity  Renal/GU negative Renal ROS  negative genitourinary   Musculoskeletal  (+) Arthritis ,   Abdominal   Peds  Hematology negative hematology ROS (+)   Anesthesia Other Findings   Reproductive/Obstetrics                            Anesthesia Physical Anesthesia Plan  ASA: II  Anesthesia Plan: General   Post-op Pain Management:    Induction: Intravenous  PONV Risk Score and Plan: Treatment may vary due to age or medical condition, Ondansetron, Dexamethasone and Scopolamine patch - Pre-op  Airway Management Planned: Oral ETT  Additional Equipment: None  Intra-op Plan:   Post-operative Plan: Extubation in OR  Informed Consent: I have reviewed the patients History and Physical, chart, labs and discussed the procedure including the risks, benefits and alternatives for the proposed anesthesia with the patient or authorized representative who has indicated his/her understanding and acceptance.   Dental advisory given  Plan Discussed with: CRNA and Anesthesiologist  Anesthesia Plan Comments:        Anesthesia Quick Evaluation

## 2017-05-19 NOTE — Anesthesia Procedure Notes (Signed)
Procedure Name: Intubation Date/Time: 05/19/2017 9:48 AM Performed by: Leonor Liv, CRNA Pre-anesthesia Checklist: Patient identified, Emergency Drugs available, Suction available and Patient being monitored Patient Re-evaluated:Patient Re-evaluated prior to induction Oxygen Delivery Method: Circle System Utilized Preoxygenation: Pre-oxygenation with 100% oxygen Induction Type: IV induction Ventilation: Mask ventilation without difficulty Laryngoscope Size: Mac and 3 Grade View: Grade I Tube type: Oral Tube size: 7.0 mm Number of attempts: 1 Airway Equipment and Method: Stylet and Oral airway Placement Confirmation: ETT inserted through vocal cords under direct vision,  positive ETCO2 and breath sounds checked- equal and bilateral Secured at: 21 cm Tube secured with: Tape Dental Injury: Teeth and Oropharynx as per pre-operative assessment

## 2017-05-20 ENCOUNTER — Encounter (HOSPITAL_COMMUNITY): Payer: Self-pay | Admitting: Neurosurgery

## 2017-05-20 LAB — CBC
HEMATOCRIT: 33.1 % — AB (ref 36.0–46.0)
Hemoglobin: 10.7 g/dL — ABNORMAL LOW (ref 12.0–15.0)
MCH: 27.9 pg (ref 26.0–34.0)
MCHC: 32.3 g/dL (ref 30.0–36.0)
MCV: 86.4 fL (ref 78.0–100.0)
PLATELETS: 282 10*3/uL (ref 150–400)
RBC: 3.83 MIL/uL — ABNORMAL LOW (ref 3.87–5.11)
RDW: 16.8 % — AB (ref 11.5–15.5)
WBC: 15.2 10*3/uL — AB (ref 4.0–10.5)

## 2017-05-20 LAB — BASIC METABOLIC PANEL
ANION GAP: 8 (ref 5–15)
BUN: 17 mg/dL (ref 6–20)
CALCIUM: 8.8 mg/dL — AB (ref 8.9–10.3)
CO2: 26 mmol/L (ref 22–32)
Chloride: 107 mmol/L (ref 101–111)
Creatinine, Ser: 1.01 mg/dL — ABNORMAL HIGH (ref 0.44–1.00)
GFR, EST AFRICAN AMERICAN: 59 mL/min — AB (ref >60)
GFR, EST NON AFRICAN AMERICAN: 51 mL/min — AB (ref >60)
Glucose, Bld: 128 mg/dL — ABNORMAL HIGH (ref 65–99)
Potassium: 3.6 mmol/L (ref 3.5–5.1)
Sodium: 141 mmol/L (ref 135–145)

## 2017-05-20 MED FILL — Sodium Chloride IV Soln 0.9%: INTRAVENOUS | Qty: 1000 | Status: AC

## 2017-05-20 MED FILL — Heparin Sodium (Porcine) Inj 1000 Unit/ML: INTRAMUSCULAR | Qty: 30 | Status: AC

## 2017-05-20 MED FILL — Thrombin For Soln 5000 Unit: CUTANEOUS | Qty: 5000 | Status: AC

## 2017-05-20 NOTE — Evaluation (Signed)
Occupational Therapy Evaluation Patient Details Name: Debbie Taylor MRN: [id number] DOB: [birth year] Today's Date: 05/20/2017    History of Present Illness Pt is an 84 y/o female who presents s/p L4-S1 PLIF on 05/19/17. PMH significant for PE, hypothyroidism, HTN.   Clinical Impression   Patient evaluated by Occupational Therapy with no further acute OT needs identified. All education has been completed and the patient has no further questions. Pt is able to perform ADLs and functional mobility with supervision.  She demonstrates good understanding of back precautions.  Spouse very supportive.   See below for any follow-up Occupational Therapy or equipment needs. OT is signing off. Thank you for this referral.      Follow Up Recommendations  No OT follow up;Supervision/Assistance - 24 hour    Equipment Recommendations  3 in 1 bedside commode    Recommendations for Other Services       Precautions / Restrictions Precautions Precautions: Fall;Back Precaution Booklet Issued: Yes (comment) Precaution Comments: Pt able to state back precautions.  Reviewed precautions and safety wtih ADLs Required Braces or Orthoses: Spinal Brace Spinal Brace: Lumbar corset;Applied in sitting position Restrictions Weight Bearing Restrictions: No      Mobility Bed Mobility Overal bed mobility: Needs Assistance Bed Mobility: Rolling;Sidelying to Sit Rolling: Supervision Sidelying to sit: Supervision       General bed mobility comments: verbal reminders for log rolling - spouse able to cue her correctly   Transfers Overall transfer level: Needs assistance Equipment used: None Transfers: Sit to/from Stand;Stand Pivot Transfers Sit to Stand: Supervision Stand pivot transfers: Supervision            Balance Overall balance assessment: Mild deficits observed, not formally tested                                         ADL either performed or assessed with clinical  judgement   ADL Overall ADL's : Needs assistance/impaired Eating/Feeding: Independent   Grooming: Wash/dry hands;Wash/dry face;Oral care;Brushing hair;Supervision/safety;Standing Grooming Details (indicate cue type and reason): reviewed safe technique for brushing teeth  Upper Body Bathing: Supervision/ safety;Set up;Sitting   Lower Body Bathing: Supervison/ safety;Sit to/from stand Lower Body Bathing Details (indicate cue type and reason): Pt able to cross ankles over knees  Upper Body Dressing : Set up;Supervision/safety;Sitting   Lower Body Dressing: Supervision/safety;Sit to/from stand Lower Body Dressing Details (indicate cue type and reason): able to cross ankles over knees for LB ADLs  Toilet Transfer: Supervision/safety;Ambulation;Comfort height toilet;Regular Toilet;BSC;Grab bars   Toileting- Clothing Manipulation and Hygiene: Supervision/safety;Sit to/from stand   Tub/ Shower Transfer: Walk-in shower;Supervision/safety;Ambulation;3 in 1   Functional mobility during ADLs: Supervision/safety General ADL Comments: Pt required min cues to don brace, but spouse able to cue her      Vision         Perception     Praxis      Pertinent Vitals/Pain Pain Assessment: No/denies pain     Hand Dominance Right   Extremity/Trunk Assessment Upper Extremity Assessment Upper Extremity Assessment: Overall WFL for tasks assessed   Lower Extremity Assessment Lower Extremity Assessment: Defer to PT evaluation   Cervical / Trunk Assessment Cervical / Trunk Assessment: Other exceptions Cervical / Trunk Exceptions: s/p surgery   Communication Communication Communication: No difficulties   Cognition Arousal/Alertness: Awake/alert Behavior During Therapy: WFL for tasks assessed/performed Overall Cognitive Status: Within Functional Limits for tasks assessed  General Comments  Pt instructed in use of reacher to retrieve items  from low surfaces.  reviewed safety with washing dishes, moving items from low cabinets to counterops, no lifting and no sitting longer than 30 mins     Exercises     Shoulder Instructions      Home Living Family/patient expects to be discharged to:: Private residence Living Arrangements: Spouse/significant other Available Help at Discharge: Family;Available 24 hours/day Type of Home: House Home Access: Stairs to enter [employer] of Steps: 3 Entrance Stairs-Rails: Left Home Layout: Multi-level Alternate Level Stairs-Number of Steps: 7 Alternate Level Stairs-Rails: Right Bathroom Shower/Tub: Occupational psychologist: Standard Bathroom Accessibility: Yes   Home Equipment: Hand held shower head;Cane - single point          Prior Functioning/Environment Level of Independence: Independent        Comments: Drives, Does all the cooking and cleaning aside from vacuuming.         OT Problem List: Decreased knowledge of precautions;Decreased knowledge of use of DME or AE      OT Treatment/Interventions:      OT Goals(Current goals can be found in the care plan section) Acute Rehab OT Goals Patient Stated Goal: Home this weekend OT Goal Formulation: With patient/family  OT Frequency:     Barriers to D/C:            Co-evaluation              AM-PAC PT "6 Clicks" Daily Activity     Outcome Measure Help from another person eating meals?: None Help from another person taking care of personal grooming?: A Little Help from another person toileting, which includes using toliet, bedpan, or urinal?: A Little Help from another person bathing (including washing, rinsing, drying)?: A Little Help from another person to put on and taking off regular upper body clothing?: A Little Help from another person to put on and taking off regular lower body clothing?: A Little 6 Click Score: 19   End of Session Equipment Utilized During Treatment: Back  brace Nurse Communication: Mobility status  Activity Tolerance: Patient tolerated treatment well Patient left: in bed;with call bell/phone within reach;with family/visitor present  OT Visit Diagnosis: Muscle weakness (generalized) (M62.81)                Time: 9379-0240 OT Time Calculation (min): 20 min Charges:  OT General Charges $OT Visit: 1 Visit OT Evaluation $OT Eval Low Complexity: 1 Low G-Codes:     [facility], OTR/L [phone]   Lucille Passy M 05/20/2017, 2:54 PM

## 2017-05-20 NOTE — Progress Notes (Signed)
Orthopedic Tech Progress Note Patient Details:  Debbie Taylor May 01, 1933 [id number]  Ortho Devices Type of Ortho Device: Other (comment) Ortho Device/Splint Location: [facility] was called for the Copy. Ortho Device/Splint Interventions: Debbie Taylor 05/20/2017, 12:50 PM

## 2017-05-20 NOTE — Evaluation (Signed)
Physical Therapy Evaluation Patient Details Name: Debbie Taylor MRN: [id number] DOB: 1933/09/26 Today's Date: 05/20/2017   History of Present Illness  Pt is an 84 y/o female who presents s/p L4-S1 PLIF on 05/19/17. PMH significant for PE, hypothyroidism, HTN.  Clinical Impression  Pt admitted with above diagnosis. Pt currently with functional limitations due to the deficits listed below (see PT Problem List). At the time of PT eval pt was able to perform transfers and ambulation with gross min guard assist to supervision for safety with the RW for support. Pt was able to progress to no AD around the room at end of session. Feel that pt will be safe for d/c home (from a PT perspective) after 1 more session tomorrow. Acutely, pt will benefit from skilled PT to increase their independence and safety with mobility to allow discharge to the venue listed below.       Follow Up Recommendations No PT follow up;Supervision for mobility/OOB    Equipment Recommendations  Rolling walker with 5" wheels;3in1 (PT)(depending on progress with PT)    Recommendations for Other Services       Precautions / Restrictions Precautions Precautions: Fall;Back Precaution Booklet Issued: Yes (comment) Precaution Comments: Reviewed handout in detail with pt and husband. Pt was cued for precautions during functional mobility.  Required Braces or Orthoses: Spinal Brace Spinal Brace: Lumbar corset;Applied in sitting position Restrictions Weight Bearing Restrictions: No      Mobility  Bed Mobility Overal bed mobility: Needs Assistance Bed Mobility: Rolling;Sidelying to Sit Rolling: Supervision Sidelying to sit: Supervision       General bed mobility comments: VC's for log roll technique. Pt was able to complete with HOB flat and rails lowered to simulate home environment.   Transfers Overall transfer level: Needs assistance Equipment used: Rolling walker (2 wheeled);None Transfers: Sit to/from Stand Sit  to Stand: Min guard         General transfer comment: Initially poweredu p to full stand and then immediately held to the walker, however by end of session pt was able to demonstrate sit>stand without AD.   Ambulation/Gait Ambulation/Gait assistance: Min guard;Supervision Ambulation Distance (Feet): 225 Feet Assistive device: Rolling walker (2 wheeled);None Gait Pattern/deviations: Step-through pattern;Decreased stride length Gait velocity: Decreased Gait velocity interpretation: Below normal speed for age/gender General Gait Details: Guarded initially due to pain but was able to improve overall fluidity of movement as pain improved. Pt ambualted in hall with RW but was able to ambulate around room with close guard for safety and no AD.   Stairs Stairs: Yes Stairs assistance: Min guard Stair Management: One rail Right;Step to pattern;Forwards Number of Stairs: 5 General stair comments: VC's for sequencing and general safety.   Wheelchair Mobility    Modified Rankin (Stroke Patients Only)       Balance Overall balance assessment: Mild deficits observed, not formally tested                                           Pertinent Vitals/Pain Pain Assessment: Faces Faces Pain Scale: Hurts a little bit Pain Location: Incision site Pain Descriptors / Indicators: Operative site guarding;Sore Pain Intervention(s): Limited activity within patient's tolerance;Monitored during session;Repositioned    Home Living Family/patient expects to be discharged to:: Private residence Living Arrangements: Spouse/significant other Available Help at Discharge: Family;Available 24 hours/day Type of Home: House Home Access: Stairs to enter Entrance Stairs-Rails:  Left Entrance Stairs-Number of Steps: 3 Home Layout: Multi-level Home Equipment: Hand held shower head;Cane - single point      Prior Function Level of Independence: Independent         Comments: Drives, Does  all the cooking and cleaning aside from vacuuming.      Hand Dominance        Extremity/Trunk Assessment   Upper Extremity Assessment Upper Extremity Assessment: Defer to OT evaluation    Lower Extremity Assessment Lower Extremity Assessment: Generalized weakness(Consistent with pre-op diagnosis - RLE pain)    Cervical / Trunk Assessment Cervical / Trunk Assessment: Other exceptions Cervical / Trunk Exceptions: s/p surgery  Communication   Communication: No difficulties  Cognition Arousal/Alertness: Awake/alert Behavior During Therapy: WFL for tasks assessed/performed Overall Cognitive Status: Within Functional Limits for tasks assessed                                        General Comments      Exercises     Assessment/Plan    PT Assessment Patient needs continued PT services  PT Problem List Decreased strength;Decreased range of motion;Decreased activity tolerance;Decreased balance;Decreased mobility;Decreased knowledge of use of DME;Decreased safety awareness;Decreased knowledge of precautions;Pain       PT Treatment Interventions DME instruction;Gait training;Stair training;Functional mobility training;Therapeutic activities;Therapeutic exercise;Neuromuscular re-education;Patient/family education    PT Goals (Current goals can be found in the Care Plan section)  Acute Rehab PT Goals Patient Stated Goal: Home this weekend PT Goal Formulation: With patient/family Time For Goal Achievement: 05/27/17 Potential to Achieve Goals: Good    Frequency Min 5X/week   Barriers to discharge        Co-evaluation               AM-PAC PT "6 Clicks" Daily Activity  Outcome Measure Difficulty turning over in bed (including adjusting bedclothes, sheets and blankets)?: None Difficulty moving from lying on back to sitting on the side of the bed? : A Little Difficulty sitting down on and standing up from a chair with arms (e.g., wheelchair, bedside  commode, etc,.)?: A Little Help needed moving to and from a bed to chair (including a wheelchair)?: A Little Help needed walking in hospital room?: A Little Help needed climbing 3-5 steps with a railing? : A Little 6 Click Score: 19    End of Session Equipment Utilized During Treatment: Gait belt;Back brace Activity Tolerance: Patient tolerated treatment well Patient left: in chair;with call bell/phone within reach;with family/visitor present Nurse Communication: Mobility status PT Visit Diagnosis: Unsteadiness on feet (R26.81);Other abnormalities of gait and mobility (R26.89);Pain Pain - part of body: (back)    Time: 5102-5852 PT Time Calculation (min) (ACUTE ONLY): 41 min   Charges:   PT Evaluation $PT Eval Moderate Complexity: 1 Mod PT Treatments $Gait Training: 23-37 mins   PT G Codes:        Rolinda Roan, PT, DPT Acute Rehabilitation Services Pager: [phone]   Thelma Comp 05/20/2017, 12:51 PM

## 2017-05-20 NOTE — Progress Notes (Signed)
Subjective: The patient is alert and pleasant.  She looks well.  Her husband is at the bedside.  Objective: Vital signs in last 24 hours: Temp:  [97 F (36.1 C)-98.7 F (37.1 C)] 97.8 F (36.6 C) (03/29 0820) Pulse Rate:  [62-105] 62 (03/29 0820) Resp:  [18-24] 20 (03/29 0820) BP: (112-164)/(45-78) 130/53 (03/29 0820) SpO2:  [94 %-98 %] 97 % (03/29 0820) Estimated body mass index is 30.81 kg/m as calculated from the following:   Height as of this encounter: 5' 5.5" (1.664 m).   Weight as of this encounter: 85.3 kg (188 lb).   Intake/Output from previous day: 03/28 0701 - 03/29 0700 In: 1485.7 [P.O.:120; I.V.:1165.7; IV Piggyback:200] Out: 915 [Urine:715; Blood:200] Intake/Output this shift: Total I/O In: 240 [P.O.:240] Out: -   Physical exam the patient is alert and oriented.  She is moving her lower extremities well.  Lab Results: Recent Labs    05/20/17 0751  WBC 15.2*  HGB 10.7*  HCT 33.1*  PLT 282   BMET Recent Labs    05/20/17 0751  NA 141  K 3.6  CL 107  CO2 26  GLUCOSE 128*  BUN 17  CREATININE 1.01*  CALCIUM 8.8*    Studies/Results: Dg Lumbar Spine 2-3 Views  Result Date: 05/19/2017 CLINICAL DATA:  Posterior interbody fusion EXAM: DG C-ARM 61-120 MIN; LUMBAR SPINE - 2-3 VIEW COMPARISON:  December 14, 2016 FLUOROSCOPY TIME:  0 minutes 37.4 seconds; 3 acquired images FINDINGS: Frontal and lateral images show pedicle screws placed at L4, L5, and S1 bilaterally with the screw tips in the respective vertebral bodies. There are disc spacers at L4-5 and L5-S1. There is no evident fracture. There is slight anterolisthesis of L5 on S1. No other spondylolisthesis evident. Disc spaces appear unremarkable. IMPRESSION: Pedicle screws at L4, L5, and S1 bilaterally. Disc spacers at L4-5 and L5-S1. Mild spondylolisthesis at L5-S1. No fracture. Disc spaces appear unremarkable. Electronically Signed   By: Lowella Grip III M.D.   On: 05/19/2017 13:28   Dg Lumbar  Spine 1 View  Result Date: 05/19/2017 CLINICAL DATA:  For posterior lumbar spine fusion EXAM: LUMBAR SPINE - 1 VIEW COMPARISON:  CT lumbar spine of 12/14/2016 and MR lumbar spine of 08/17/2016 FINDINGS: And instrument is positioned posteriorly beneath the spinous process of L4, directed toward the L4-5 interspace for localization. IMPRESSION: Instrument posteriorly directed toward the L4-5 interspace for localization. Electronically Signed   By: Ivar Drape M.D.   On: 05/19/2017 11:01   Dg C-arm 1-60 Min  Result Date: 05/19/2017 CLINICAL DATA:  Posterior interbody fusion EXAM: DG C-ARM 61-120 MIN; LUMBAR SPINE - 2-3 VIEW COMPARISON:  December 14, 2016 FLUOROSCOPY TIME:  0 minutes 37.4 seconds; 3 acquired images FINDINGS: Frontal and lateral images show pedicle screws placed at L4, L5, and S1 bilaterally with the screw tips in the respective vertebral bodies. There are disc spacers at L4-5 and L5-S1. There is no evident fracture. There is slight anterolisthesis of L5 on S1. No other spondylolisthesis evident. Disc spaces appear unremarkable. IMPRESSION: Pedicle screws at L4, L5, and S1 bilaterally. Disc spacers at L4-5 and L5-S1. Mild spondylolisthesis at L5-S1. No fracture. Disc spaces appear unremarkable. Electronically Signed   By: Lowella Grip III M.D.   On: 05/19/2017 13:28    Assessment/Plan: Postop day #1: The patient is doing well.  We will mobilize her with PT.  She will likely go home over the weekend.  LOS: 1 day     Debbie Taylor 05/20/2017,  9:20 AM

## 2017-05-20 NOTE — Care Management Note (Signed)
Case Management Note  Patient Details  Name: Debbie Taylor MRN: [id number] Date of Birth: 06-27-1933  Subjective/Objective:     Pt is s/p lumbar fusion. She is from home with spouse.               Action/Plan: Awaiting PT/OT evals. CM following for d/c needs, physician orders.    Expected Discharge Date:                  Expected Discharge Plan:     In-House Referral:     Discharge planning Services     Post Acute Care Choice:    Choice offered to:     DME Arranged:    DME Agency:     HH Arranged:    HH Agency:     Status of Service:  In process, will continue to follow  If discussed at Long Length of Stay Meetings, dates discussed:    Additional Comments:  Pollie Friar, RN 05/20/2017, 11:35 AM

## 2017-05-21 MED ORDER — CYCLOBENZAPRINE HCL 5 MG PO TABS: 5.0000 mg | ORAL_TABLET | Freq: Three times a day (TID) | ORAL | 1 refills | Status: DC | PRN

## 2017-05-21 MED ORDER — HYDROMORPHONE HCL 2 MG PO TABS: 2.0000 mg | ORAL_TABLET | ORAL | 0 refills | Status: DC | PRN

## 2017-05-21 NOTE — Care Management (Signed)
Pt and husband state patient needs 3n1 delivered to room.  Pt can borrow a RW from a friend if needed. Jermaine with [facility] notified.

## 2017-05-21 NOTE — Progress Notes (Signed)
Physical Therapy Treatment Patient Details Name: Debbie Taylor MRN: [id number] DOB: 1933/05/16 Today's Date: 05/21/2017    History of Present Illness Pt is an 84 y/o female who presents s/p L4-S1 PLIF on 05/19/17. PMH significant for PE, hypothyroidism, HTN.    PT Comments    Patient progressing well towards PT goals. Tolerated gait training and stair training with Min guard-supervision for safety. Able to recall 3/3 back precautions and demonstrate good log roll technique without cues. Feels better using RW at this time. Education re: getting in/out of car. Plans to d/c home today, only complaint is headache and mild soreness in back. Will follow.    Follow Up Recommendations  No PT follow up;Supervision for mobility/OOB     Equipment Recommendations  Rolling walker with 5" wheels;3in1 (PT)    Recommendations for Other Services       Precautions / Restrictions Precautions Precautions: Fall;Back Precaution Booklet Issued: Yes (comment) Precaution Comments: Able to recall 3/3 Required Braces or Orthoses: Spinal Brace Spinal Brace: Lumbar corset;Applied in sitting position Restrictions Weight Bearing Restrictions: No    Mobility  Bed Mobility Overal bed mobility: Needs Assistance Bed Mobility: Rolling;Sidelying to Sit Rolling: Modified independent (Device/Increase time) Sidelying to sit: Modified independent (Device/Increase time)       General bed mobility comments: Good demo of log roll technique; HOB flat, no use of rails.   Transfers Overall transfer level: Needs assistance Equipment used: None Transfers: Sit to/from Stand Sit to Stand: Supervision         General transfer comment: Supervision for safety, walked UEs up thighs to stand initially. Transferred tochair post ambulation.  Ambulation/Gait Ambulation/Gait assistance: Supervision Ambulation Distance (Feet): 250 Feet Assistive device: Rolling walker (2 wheeled) Gait Pattern/deviations: Step-through  pattern;Decreased stride length Gait velocity: Decreased   General Gait Details: Slow, steady gait with RW for support, able to remove 1 UE without issues.    Stairs Stairs: Yes   Stair Management: One rail Right;Step to pattern;Forwards Number of Stairs: 6 General stair comments: VC's for sequencing and general safety.   Wheelchair Mobility    Modified Rankin (Stroke Patients Only)       Balance Overall balance assessment: Needs assistance Sitting-balance support: Feet supported;No upper extremity supported Sitting balance-Leahy Scale: Good     Standing balance support: During functional activity Standing balance-Leahy Scale: Good Standing balance comment: Able to donn brace in standing with setup                            Cognition Arousal/Alertness: Awake/alert Behavior During Therapy: WFL for tasks assessed/performed Overall Cognitive Status: Within Functional Limits for tasks assessed                                        Exercises      General Comments        Pertinent Vitals/Pain Pain Assessment: Faces Faces Pain Scale: Hurts a little bit Pain Location: headache Pain Descriptors / Indicators: Headache Pain Intervention(s): Monitored during session;Repositioned    Home Living                      Prior Function            PT Goals (current goals can now be found in the care plan section) Progress towards PT goals: Progressing toward goals    Frequency  Min 5X/week      PT Plan Current plan remains appropriate    Co-evaluation              AM-PAC PT "6 Clicks" Daily Activity  Outcome Measure  Difficulty turning over in bed (including adjusting bedclothes, sheets and blankets)?: None Difficulty moving from lying on back to sitting on the side of the bed? : None Difficulty sitting down on and standing up from a chair with arms (e.g., wheelchair, bedside commode, etc,.)?: None Help needed  moving to and from a bed to chair (including a wheelchair)?: None Help needed walking in hospital room?: A Little Help needed climbing 3-5 steps with a railing? : A Little 6 Click Score: 22    End of Session Equipment Utilized During Treatment: Gait belt;Back brace Activity Tolerance: Patient tolerated treatment well Patient left: in chair;with call bell/phone within reach Nurse Communication: Mobility status PT Visit Diagnosis: Unsteadiness on feet (R26.81);Other abnormalities of gait and mobility (R26.89);Pain Pain - part of body: (head)     Time: 6286-3817 PT Time Calculation (min) (ACUTE ONLY): 17 min  Charges:  $Gait Training: 8-22 mins                    G Codes:       Wray Kearns, PT, DPT [phone]     Marguarite Arbour A Logun Colavito 05/21/2017, 9:16 AM

## 2017-05-21 NOTE — Discharge Summary (Signed)
Physician Discharge Summary  Patient ID: Debbie Taylor MRN: [id number] DOB/AGE: May 14, 1933 84 y.o.  Admit date: 05/19/2017 Discharge date: 05/21/2017  Admission Diagnoses: L4-5 and L5-S1 spondylolisthesis, spinal stenosis, lumbago, lumbar radiculopathy, neurogenic claudication  Discharge Diagnoses: The same Active Problems:   Spondylolisthesis of lumbar region   Discharged Condition: good  Hospital Course: I performed an L4-5 and L5-S1 decompression, instrumentation, and fusion on the patient on 05/19/2017.  The surgery went well.  The patient's postoperative course was unremarkable.  On postoperative day #2 the patient requested discharge to home.  The patient, and her husband, were given oral and written discharge instructions.  All their questions were answered.  Consults: Physical therapy Significant Diagnostic Studies: None Treatments: L4-5 and L5-S1 decompression, instrumentation, and fusion. Discharge Exam: Blood pressure (!) 125/53, pulse 87, temperature 98.7 F (37.1 C), temperature source Oral, resp. rate 20, height 5' 5.5" (1.664 m), weight 85.3 kg (188 lb), SpO2 97 %. The patient is alert and pleasant.  She looks well.  Her strength is normal.  Disposition: Home  Discharge Instructions    Call MD for:  difficulty breathing, headache or visual disturbances   Complete by:  As directed    Call MD for:  extreme fatigue   Complete by:  As directed    Call MD for:  hives   Complete by:  As directed    Call MD for:  persistant dizziness or light-headedness   Complete by:  As directed    Call MD for:  persistant nausea and vomiting   Complete by:  As directed    Call MD for:  redness, tenderness, or signs of infection (pain, swelling, redness, odor or green/yellow discharge around incision site)   Complete by:  As directed    Call MD for:  severe uncontrolled pain   Complete by:  As directed    Call MD for:  temperature >100.4   Complete by:  As directed    Diet -  low sodium heart healthy   Complete by:  As directed    Discharge instructions   Complete by:  As directed    Call [phone] for a followup appointment. Take a stool softener while you are using pain medications.   Driving Restrictions   Complete by:  As directed    Do not drive for 2 weeks.   Increase activity slowly   Complete by:  As directed    Lifting restrictions   Complete by:  As directed    Do not lift more than 5 pounds. No excessive bending or twisting.   May shower / Bathe   Complete by:  As directed    Remove the dressing for 3 days after surgery.  You may shower, but leave the incision alone.   Remove dressing in 24 hours   Complete by:  As directed      Allergies as of 05/21/2017      Reactions   Codeine Hives, Itching   Statins    Muscle pain   Celebrex [celecoxib] Palpitations      Medication List    STOP taking these medications   acetaminophen 500 MG tablet Commonly known as:  TYLENOL   AUGMENTIN 875-125 MG tablet Generic drug:  amoxicillin-clavulanate     TAKE these medications   amLODipine 5 MG tablet Commonly known as:  NORVASC Take 5 mg by mouth daily.   aspirin 81 MG tablet Take 81 mg by mouth daily.   CALCIUM 600+D 600-800 MG-UNIT Tabs Generic drug:  Calcium Carb-Cholecalciferol Take 1 tablet by mouth 2 (two) times daily.   cyclobenzaprine 5 MG tablet Commonly known as:  FLEXERIL Take 1 tablet (5 mg total) by mouth 3 (three) times daily as needed for muscle spasms.   ezetimibe 10 MG tablet Commonly known as:  ZETIA Take 10 mg by mouth daily.   fexofenadine 180 MG tablet Commonly known as:  ALLEGRA Take 180 mg by mouth daily.   Fish Oil 1000 MG Caps Take 1,000 mg by mouth daily.   HYDROmorphone 2 MG tablet Commonly known as:  DILAUDID Take 1 tablet (2 mg total) by mouth every 4 (four) hours as needed for severe pain.   levothyroxine 75 MCG tablet Commonly known as:  SYNTHROID, LEVOTHROID Take 75 mcg by mouth daily before  breakfast.   metoprolol succinate 25 MG 24 hr tablet Commonly known as:  TOPROL-XL Take 25 mg by mouth daily.   mometasone 50 MCG/ACT nasal spray Commonly known as:  NASONEX Place 2 sprays into the nose daily as needed (congestion).   pantoprazole 40 MG tablet Commonly known as:  PROTONIX Take 40 mg by mouth daily as needed (acid reflux).   PROBIOTIC DAILY PO Take 1 capsule by mouth daily.        Signed: Ophelia Charter 05/21/2017, 9:50 AM

## 2017-05-24 NOTE — Consult Note (Signed)
            [facility] CM Primary Care Navigator  05/24/2017  Debbie Taylor [birth year] [id number]   Went to seepatient at the bedsideto identify possible discharge needs but she was alreadydischarged.  Patient was discharged homeover the weekend.  PerMD note, patient was admitted for spondylolisthesis of lumbar region, spinal stenosis, lumbar radiculopathy and neurogenic claudication. (status post L4-5 and L5-S1 decompression, instrumentation, and fusion)   Primary care provider's office called(Kelly) to notify of patient's discharge andneed for post hospital follow-up and transition of care (TOC). Notified ofpatient'shealth issues needing follow-up.  Made aware to refer patient to [facility] CM if deemed necessary and appropriate for services.  For additional questions please contact:  Edwena Felty A. Mosetta Ferdinand, BSN, RN-BC [facility] PRIMARY CARE Navigator Cell: [phone]

## 2017-05-27 DIAGNOSIS — B373 Candidiasis of vulva and vagina: Secondary | ICD-10-CM | POA: Diagnosis not present

## 2017-05-27 DIAGNOSIS — M48061 Spinal stenosis, lumbar region without neurogenic claudication: Secondary | ICD-10-CM | POA: Diagnosis not present

## 2017-05-27 DIAGNOSIS — Z6831 Body mass index (BMI) 31.0-31.9, adult: Secondary | ICD-10-CM | POA: Diagnosis not present

## 2017-05-27 DIAGNOSIS — R600 Localized edema: Secondary | ICD-10-CM | POA: Diagnosis not present

## 2017-05-27 DIAGNOSIS — N390 Urinary tract infection, site not specified: Secondary | ICD-10-CM | POA: Diagnosis not present

## 2017-05-27 DIAGNOSIS — M4327 Fusion of spine, lumbosacral region: Secondary | ICD-10-CM | POA: Diagnosis not present

## 2017-05-27 DIAGNOSIS — R300 Dysuria: Secondary | ICD-10-CM | POA: Diagnosis not present

## 2017-06-20 DIAGNOSIS — Z6830 Body mass index (BMI) 30.0-30.9, adult: Secondary | ICD-10-CM | POA: Diagnosis not present

## 2017-06-20 DIAGNOSIS — M4316 Spondylolisthesis, lumbar region: Secondary | ICD-10-CM | POA: Diagnosis not present

## 2017-06-20 DIAGNOSIS — M48062 Spinal stenosis, lumbar region with neurogenic claudication: Secondary | ICD-10-CM | POA: Diagnosis not present

## 2017-06-20 DIAGNOSIS — I10 Essential (primary) hypertension: Secondary | ICD-10-CM | POA: Diagnosis not present

## 2017-07-14 DIAGNOSIS — E038 Other specified hypothyroidism: Secondary | ICD-10-CM | POA: Diagnosis not present

## 2017-07-14 DIAGNOSIS — R7301 Impaired fasting glucose: Secondary | ICD-10-CM | POA: Diagnosis not present

## 2017-07-14 DIAGNOSIS — I10 Essential (primary) hypertension: Secondary | ICD-10-CM | POA: Diagnosis not present

## 2017-07-14 DIAGNOSIS — E7849 Other hyperlipidemia: Secondary | ICD-10-CM | POA: Diagnosis not present

## 2017-07-21 DIAGNOSIS — I10 Essential (primary) hypertension: Secondary | ICD-10-CM | POA: Diagnosis not present

## 2017-07-21 DIAGNOSIS — R82998 Other abnormal findings in urine: Secondary | ICD-10-CM | POA: Diagnosis not present

## 2017-07-21 DIAGNOSIS — Z0000 Encounter for general adult medical examination without abnormal findings: Secondary | ICD-10-CM | POA: Diagnosis not present

## 2017-07-21 DIAGNOSIS — N183 Chronic kidney disease, stage 3 (moderate): Secondary | ICD-10-CM | POA: Diagnosis not present

## 2017-07-21 DIAGNOSIS — E668 Other obesity: Secondary | ICD-10-CM | POA: Diagnosis not present

## 2017-07-21 DIAGNOSIS — E038 Other specified hypothyroidism: Secondary | ICD-10-CM | POA: Diagnosis not present

## 2017-07-21 DIAGNOSIS — Z6830 Body mass index (BMI) 30.0-30.9, adult: Secondary | ICD-10-CM | POA: Diagnosis not present

## 2017-07-21 DIAGNOSIS — R7301 Impaired fasting glucose: Secondary | ICD-10-CM | POA: Diagnosis not present

## 2017-07-21 DIAGNOSIS — K219 Gastro-esophageal reflux disease without esophagitis: Secondary | ICD-10-CM | POA: Diagnosis not present

## 2017-07-21 DIAGNOSIS — E7849 Other hyperlipidemia: Secondary | ICD-10-CM | POA: Diagnosis not present

## 2017-07-21 DIAGNOSIS — E669 Obesity, unspecified: Secondary | ICD-10-CM | POA: Diagnosis not present

## 2017-08-15 DIAGNOSIS — R69 Illness, unspecified: Secondary | ICD-10-CM | POA: Diagnosis not present

## 2017-08-18 DIAGNOSIS — Z136 Encounter for screening for cardiovascular disorders: Secondary | ICD-10-CM | POA: Diagnosis not present

## 2017-08-18 DIAGNOSIS — J029 Acute pharyngitis, unspecified: Secondary | ICD-10-CM | POA: Diagnosis not present

## 2017-09-06 DIAGNOSIS — M48062 Spinal stenosis, lumbar region with neurogenic claudication: Secondary | ICD-10-CM | POA: Diagnosis not present

## 2017-09-06 DIAGNOSIS — M4316 Spondylolisthesis, lumbar region: Secondary | ICD-10-CM | POA: Diagnosis not present

## 2017-09-12 DIAGNOSIS — R300 Dysuria: Secondary | ICD-10-CM | POA: Diagnosis not present

## 2017-09-12 DIAGNOSIS — N390 Urinary tract infection, site not specified: Secondary | ICD-10-CM | POA: Diagnosis not present

## 2017-09-12 DIAGNOSIS — M48061 Spinal stenosis, lumbar region without neurogenic claudication: Secondary | ICD-10-CM | POA: Diagnosis not present

## 2017-09-12 DIAGNOSIS — R600 Localized edema: Secondary | ICD-10-CM | POA: Diagnosis not present

## 2017-09-12 DIAGNOSIS — I10 Essential (primary) hypertension: Secondary | ICD-10-CM | POA: Diagnosis not present

## 2017-09-12 DIAGNOSIS — L309 Dermatitis, unspecified: Secondary | ICD-10-CM | POA: Diagnosis not present

## 2017-09-19 DIAGNOSIS — I129 Hypertensive chronic kidney disease with stage 1 through stage 4 chronic kidney disease, or unspecified chronic kidney disease: Secondary | ICD-10-CM | POA: Diagnosis not present

## 2017-09-20 DIAGNOSIS — L259 Unspecified contact dermatitis, unspecified cause: Secondary | ICD-10-CM | POA: Diagnosis not present

## 2017-09-20 DIAGNOSIS — I10 Essential (primary) hypertension: Secondary | ICD-10-CM | POA: Diagnosis not present

## 2017-11-10 DIAGNOSIS — I10 Essential (primary) hypertension: Secondary | ICD-10-CM | POA: Diagnosis not present

## 2017-11-19 DIAGNOSIS — Z23 Encounter for immunization: Secondary | ICD-10-CM | POA: Diagnosis not present

## 2017-12-12 DIAGNOSIS — H35032 Hypertensive retinopathy, left eye: Secondary | ICD-10-CM | POA: Diagnosis not present

## 2017-12-12 DIAGNOSIS — Z961 Presence of intraocular lens: Secondary | ICD-10-CM | POA: Diagnosis not present

## 2017-12-15 DIAGNOSIS — I10 Essential (primary) hypertension: Secondary | ICD-10-CM | POA: Diagnosis not present

## 2017-12-15 DIAGNOSIS — Z6830 Body mass index (BMI) 30.0-30.9, adult: Secondary | ICD-10-CM | POA: Diagnosis not present

## 2018-01-16 ENCOUNTER — Other Ambulatory Visit: Payer: Self-pay | Admitting: Internal Medicine

## 2018-01-16 DIAGNOSIS — Z1231 Encounter for screening mammogram for malignant neoplasm of breast: Secondary | ICD-10-CM

## 2018-01-18 DIAGNOSIS — I10 Essential (primary) hypertension: Secondary | ICD-10-CM | POA: Diagnosis not present

## 2018-01-18 DIAGNOSIS — Z6831 Body mass index (BMI) 31.0-31.9, adult: Secondary | ICD-10-CM | POA: Diagnosis not present

## 2018-03-01 ENCOUNTER — Ambulatory Visit
Admission: RE | Admit: 2018-03-01 | Discharge: 2018-03-01 | Disposition: A | Discharge status: Home / Self Care | Payer: Medicare Other | Source: Ambulatory Visit | Attending: Internal Medicine | Admitting: Internal Medicine

## 2018-03-01 DIAGNOSIS — Z1231 Encounter for screening mammogram for malignant neoplasm of breast: Secondary | ICD-10-CM

## 2018-05-04 ENCOUNTER — Other Ambulatory Visit: Payer: Self-pay | Admitting: Internal Medicine

## 2018-05-04 DIAGNOSIS — M5416 Radiculopathy, lumbar region: Secondary | ICD-10-CM

## 2018-05-07 ENCOUNTER — Ambulatory Visit
Admission: RE | Admit: 2018-05-07 | Discharge: 2018-05-07 | Disposition: A | Discharge status: Home / Self Care | Payer: Medicare Other | Source: Ambulatory Visit | Attending: Internal Medicine | Admitting: Internal Medicine

## 2018-05-07 ENCOUNTER — Other Ambulatory Visit: Payer: Self-pay

## 2018-05-07 DIAGNOSIS — M5416 Radiculopathy, lumbar region: Secondary | ICD-10-CM

## 2018-05-23 ENCOUNTER — Other Ambulatory Visit: Payer: Self-pay | Admitting: Neurosurgery

## 2018-06-29 ENCOUNTER — Other Ambulatory Visit (HOSPITAL_COMMUNITY): Payer: Medicare Other

## 2018-08-08 NOTE — Pre-Procedure Instructions (Signed)
Amya Hlad Connelley  08/08/2018      [place], [place] [address] [place] [place] [address] Phone: [phone] Fax: [phone]    Your procedure is scheduled on Thurs., August 17, 2018 from 9:15AM-12:51PM  Report to [facility] Entrance "A" at 7:15AM  Call this number if you have problems the morning of surgery:  [phone]   Remember:  Do not eat or drink after midnight on June 24th    Take these medicines the morning of surgery with A SIP OF WATER: Fexofenadine (ALLEGRA), Levothyroxine (SYNTHROID, LEVOTHROID), and Metoprolol succinate (TOPROL-XL)  If needed: Acetaminophen (TYLENOL), EPINEPHrine, Mometasone (NASONEX), and Pantoprazole (PROTONIX)  Follow your surgeon's instructions on when to stop Aspirin.  If no instructions were given by your surgeon then you will need to call the office to get those instructions.    7 days before surgery (08/10/18), stop taking all Other Aspirin Products, Vitamins, Fish oils, and Herbal medications. Also stop all NSAIDS i.e. Advil, Ibuprofen, Motrin, Aleve, Anaprox, Naproxen, BC, Goody Powders, and all Supplements.    Special instructions:  [place]- Preparing For Surgery  Before surgery, you can play an important role. Because skin is not sterile, your skin needs to be as free of germs as possible. You can reduce the number of germs on your skin by washing with CHG (chlorahexidine gluconate) Soap before surgery.  CHG is an antiseptic cleaner which kills germs and bonds with the skin to continue killing germs even after washing.   Please do not use if you have an allergy to CHG or antibacterial soaps. If your skin becomes reddened/irritated stop using the CHG.  Do not shave (including legs and underarms) for at least 48 hours prior to first CHG shower. It is OK to shave your face.  Please follow these instructions carefully.   1. Shower the NIGHT BEFORE SURGERY and the  MORNING OF SURGERY with CHG.   2. If you chose to wash your hair, wash your hair first as usual with your normal shampoo.  3. After you shampoo, rinse your hair and body thoroughly to remove the shampoo.  4. Use CHG as you would any other liquid soap. You can apply CHG directly to the skin and wash gently with a scrungie or a clean washcloth.   5. Apply the CHG Soap to your body ONLY FROM THE NECK DOWN.  Do not use on open wounds or open sores. Avoid contact with your eyes, ears, mouth and genitals (private parts). Wash Face and genitals (private parts)  with your normal soap.  6. Wash thoroughly, paying special attention to the area where your surgery will be performed.  7. Thoroughly rinse your body with warm water from the neck down.  8. DO NOT shower/wash with your normal soap after using and rinsing off the CHG Soap.  9. Pat yourself dry with a CLEAN TOWEL.  10. Wear CLEAN PAJAMAS to bed the night before surgery, wear comfortable clothes the morning of surgery  11. Place CLEAN SHEETS on your bed the night of your first shower and DO NOT SLEEP WITH PETS.  Day of Surgery:  Oral Hygiene is also important to reduce your risk of infection.  Remember - BRUSH YOUR TEETH THE MORNING OF SURGERY WITH YOUR REGULAR TOOTHPASTE   Do not wear jewelry, make-up or nail polish.  Do not wear lotions, powders, or perfumes, or deodorant.  Do not shave 48 hours prior to surgery.  Do not bring valuables to the hospital.  [facility] is not responsible for any belongings or valuables.  Contacts, dentures or bridgework may not be worn into surgery.   For patients admitted to the hospital, discharge time will be determined by your treatment team.  Patients discharged the day of surgery will not be allowed to drive home.   Please wear clean clothes to the hospital/surgery center.    Please read over the following fact sheets that you were given. Pain Booklet, Coughing and Deep Breathing, MRSA  Information and Surgical Site Infection Prevention

## 2018-08-09 ENCOUNTER — Encounter (HOSPITAL_COMMUNITY)
Admission: RE | Admit: 2018-08-09 | Discharge: 2018-08-09 | Disposition: A | Discharge status: Home / Self Care | Payer: Medicare Other | Source: Ambulatory Visit | Attending: Neurosurgery | Admitting: Neurosurgery

## 2018-08-09 ENCOUNTER — Encounter (HOSPITAL_COMMUNITY): Payer: Self-pay

## 2018-08-09 ENCOUNTER — Other Ambulatory Visit: Payer: Self-pay

## 2018-08-09 ENCOUNTER — Other Ambulatory Visit: Payer: Self-pay | Admitting: Neurosurgery

## 2018-08-09 DIAGNOSIS — Z01818 Encounter for other preprocedural examination: Secondary | ICD-10-CM | POA: Insufficient documentation

## 2018-08-09 DIAGNOSIS — I10 Essential (primary) hypertension: Secondary | ICD-10-CM | POA: Diagnosis not present

## 2018-08-09 HISTORY — DX: Other intervertebral disc degeneration, lumbar region without mention of lumbar back pain or lower extremity pain: M51.369

## 2018-08-09 HISTORY — DX: Spondylolisthesis, lumbar region: M43.16

## 2018-08-09 HISTORY — DX: Personal history of other diseases of the digestive system: Z87.19

## 2018-08-09 LAB — TYPE AND SCREEN
ABO/RH(D): A POS
Antibody Screen: NEGATIVE

## 2018-08-09 LAB — CBC
HCT: 40.3 % (ref 36.0–46.0)
Hemoglobin: 12.7 g/dL (ref 12.0–15.0)
MCH: 27.8 pg (ref 26.0–34.0)
MCHC: 31.5 g/dL (ref 30.0–36.0)
MCV: 88.2 fL (ref 80.0–100.0)
Platelets: 237 10*3/uL (ref 150–400)
RBC: 4.57 MIL/uL (ref 3.87–5.11)
RDW: 18.5 % — ABNORMAL HIGH (ref 11.5–15.5)
WBC: 8.6 10*3/uL (ref 4.0–10.5)
nRBC: 0 % (ref 0.0–0.2)

## 2018-08-09 LAB — BASIC METABOLIC PANEL
Anion gap: 10 (ref 5–15)
BUN: 25 mg/dL — ABNORMAL HIGH (ref 8–23)
CO2: 25 mmol/L (ref 22–32)
Calcium: 9.4 mg/dL (ref 8.9–10.3)
Chloride: 106 mmol/L (ref 98–111)
Creatinine, Ser: 1 mg/dL (ref 0.44–1.00)
GFR calc Af Amer: >60 mL/min (ref >60)
GFR calc non Af Amer: 52 mL/min — ABNORMAL LOW (ref >60)
Glucose, Bld: 107 mg/dL — ABNORMAL HIGH (ref 70–99)
Potassium: 4 mmol/L (ref 3.5–5.1)
Sodium: 141 mmol/L (ref 135–145)

## 2018-08-09 LAB — SURGICAL PCR SCREEN
MRSA, PCR: NEGATIVE
Staphylococcus aureus: NEGATIVE

## 2018-08-09 NOTE — Progress Notes (Signed)
Pt denies SOB, chest pain, and being under the care of a cardiologist. Pt denies having a stress test, echo and cardiac cath. Pt denies having an EKG and chest xray within the last year. Pt denies recent labs. Pt stated that her PCP is Dr. Marton Redwood at [facility] denies that she and family members tested positive for COVID-19 ( pt scheduled for test on 08/14/18; pt reminded to quarantine).  Pt denies that she and family members experienced the following symptoms:  Cough yes/no: No Fever (>100.79F)  yes/no: No Runny nose yes/no: No Sore throat yes/no: No Difficulty breathing/shortness of breath  yes/no: No  Have you or a family member traveled in the last 14 days and where? yes/no: No  Pt reminded that hospital visitation restrictions are in effect and the importance of the restrictions.   Pt verbalized understanding of all pre-op instructions.

## 2018-08-14 ENCOUNTER — Other Ambulatory Visit (HOSPITAL_COMMUNITY)
Admission: RE | Admit: 2018-08-14 | Discharge: 2018-08-14 | Disposition: A | Discharge status: Home / Self Care | Payer: Medicare Other | Source: Ambulatory Visit | Attending: Neurosurgery | Admitting: Neurosurgery

## 2018-08-14 DIAGNOSIS — Z1159 Encounter for screening for other viral diseases: Secondary | ICD-10-CM | POA: Insufficient documentation

## 2018-08-14 LAB — SARS CORONAVIRUS 2 (TAT 6-24 HRS): SARS Coronavirus 2: NEGATIVE

## 2018-08-17 ENCOUNTER — Inpatient Hospital Stay (HOSPITAL_COMMUNITY): Payer: Medicare Other | Admitting: Physician Assistant

## 2018-08-17 ENCOUNTER — Other Ambulatory Visit: Payer: Self-pay

## 2018-08-17 ENCOUNTER — Inpatient Hospital Stay (HOSPITAL_COMMUNITY): Payer: Medicare Other | Admitting: Certified Registered"

## 2018-08-17 ENCOUNTER — Inpatient Hospital Stay (HOSPITAL_COMMUNITY)
Admission: RE | Admit: 2018-08-17 | Discharge: 2018-08-18 | DRG: 455 | Disposition: A | Discharge status: Home / Self Care | Payer: Medicare Other | Attending: Neurosurgery | Admitting: Neurosurgery

## 2018-08-17 ENCOUNTER — Encounter (HOSPITAL_COMMUNITY)
Admission: RE | Disposition: A | Discharge status: Home / Self Care | Payer: Self-pay | Source: Home / Self Care | Attending: Neurosurgery

## 2018-08-17 ENCOUNTER — Inpatient Hospital Stay (HOSPITAL_COMMUNITY): Payer: Medicare Other

## 2018-08-17 ENCOUNTER — Encounter (HOSPITAL_COMMUNITY): Payer: Self-pay | Admitting: *Deleted

## 2018-08-17 DIAGNOSIS — M48062 Spinal stenosis, lumbar region with neurogenic claudication: Secondary | ICD-10-CM | POA: Diagnosis present

## 2018-08-17 DIAGNOSIS — E039 Hypothyroidism, unspecified: Secondary | ICD-10-CM | POA: Diagnosis present

## 2018-08-17 DIAGNOSIS — Z86711 Personal history of pulmonary embolism: Secondary | ICD-10-CM

## 2018-08-17 DIAGNOSIS — Z7982 Long term (current) use of aspirin: Secondary | ICD-10-CM | POA: Diagnosis not present

## 2018-08-17 DIAGNOSIS — Z79899 Other long term (current) drug therapy: Secondary | ICD-10-CM

## 2018-08-17 DIAGNOSIS — K219 Gastro-esophageal reflux disease without esophagitis: Secondary | ICD-10-CM | POA: Diagnosis present

## 2018-08-17 DIAGNOSIS — I10 Essential (primary) hypertension: Secondary | ICD-10-CM | POA: Diagnosis present

## 2018-08-17 DIAGNOSIS — M5116 Intervertebral disc disorders with radiculopathy, lumbar region: Principal | ICD-10-CM | POA: Diagnosis present

## 2018-08-17 DIAGNOSIS — R400 Somnolence: Secondary | ICD-10-CM | POA: Diagnosis not present

## 2018-08-17 DIAGNOSIS — Z79890 Hormone replacement therapy (postmenopausal): Secondary | ICD-10-CM

## 2018-08-17 DIAGNOSIS — Z419 Encounter for procedure for purposes other than remedying health state, unspecified: Secondary | ICD-10-CM

## 2018-08-17 DIAGNOSIS — Z90710 Acquired absence of both cervix and uterus: Secondary | ICD-10-CM

## 2018-08-17 DIAGNOSIS — M4316 Spondylolisthesis, lumbar region: Secondary | ICD-10-CM | POA: Diagnosis present

## 2018-08-17 DIAGNOSIS — M5136 Other intervertebral disc degeneration, lumbar region: Secondary | ICD-10-CM

## 2018-08-17 HISTORY — DX: Other seasonal allergic rhinitis: J30.2

## 2018-08-17 SURGERY — POSTERIOR LUMBAR FUSION 1 LEVEL
Anesthesia: General | Site: Spine Lumbar

## 2018-08-17 MED ORDER — FENTANYL CITRATE (PF) 250 MCG/5ML IJ SOLN
INTRAMUSCULAR | Status: AC
  Filled 2018-08-17: qty 5

## 2018-08-17 MED ORDER — LACTATED RINGERS IV SOLN
INTRAVENOUS | Status: DC
  Administered 2018-08-17: 1000 mL via INTRAVENOUS
  Administered 2018-08-17 (×2): via INTRAVENOUS

## 2018-08-17 MED ORDER — SODIUM CHLORIDE 0.9% FLUSH: 3.0000 mL | INTRAVENOUS | Status: DC | PRN

## 2018-08-17 MED ORDER — METOPROLOL SUCCINATE ER 25 MG PO TB24: 25.0000 mg | ORAL_TABLET | Freq: Every day | ORAL | Status: DC

## 2018-08-17 MED ORDER — PROMETHAZINE HCL 25 MG/ML IJ SOLN
6.2500 mg | INTRAMUSCULAR | Status: DC | PRN
  Administered 2018-08-17: 6.25 mg via INTRAVENOUS

## 2018-08-17 MED ORDER — SODIUM CHLORIDE 0.9 % IV SOLN
INTRAVENOUS | Status: DC | PRN
  Administered 2018-08-17: 09:00:00 25 ug/min via INTRAVENOUS

## 2018-08-17 MED ORDER — HYDROMORPHONE HCL 1 MG/ML IJ SOLN: 0.2500 mg | INTRAMUSCULAR | Status: DC | PRN

## 2018-08-17 MED ORDER — ROCURONIUM BROMIDE 10 MG/ML (PF) SYRINGE
PREFILLED_SYRINGE | INTRAVENOUS | Status: DC | PRN
  Administered 2018-08-17: 20 mg via INTRAVENOUS
  Administered 2018-08-17: 40 mg via INTRAVENOUS
  Administered 2018-08-17 (×2): 20 mg via INTRAVENOUS

## 2018-08-17 MED ORDER — LIDOCAINE 2% (20 MG/ML) 5 ML SYRINGE
INTRAMUSCULAR | Status: AC
  Filled 2018-08-17: qty 5

## 2018-08-17 MED ORDER — PROPOFOL 10 MG/ML IV BOLUS
INTRAVENOUS | Status: AC
  Filled 2018-08-17: qty 20

## 2018-08-17 MED ORDER — 0.9 % SODIUM CHLORIDE (POUR BTL) OPTIME
TOPICAL | Status: DC | PRN
  Administered 2018-08-17: 1000 mL

## 2018-08-17 MED ORDER — ONDANSETRON HCL 4 MG/2ML IJ SOLN
INTRAMUSCULAR | Status: DC | PRN
  Administered 2018-08-17: 4 mg via INTRAVENOUS

## 2018-08-17 MED ORDER — BISACODYL 10 MG RE SUPP: 10.0000 mg | Freq: Every day | RECTAL | Status: DC | PRN

## 2018-08-17 MED ORDER — DOCUSATE SODIUM 100 MG PO CAPS
100.0000 mg | ORAL_CAPSULE | Freq: Two times a day (BID) | ORAL | Status: DC
  Administered 2018-08-17: 100 mg via ORAL
  Filled 2018-08-17: qty 1

## 2018-08-17 MED ORDER — CEFAZOLIN SODIUM-DEXTROSE 2-4 GM/100ML-% IV SOLN
2.0000 g | Freq: Three times a day (TID) | INTRAVENOUS | Status: AC
  Administered 2018-08-17 – 2018-08-18 (×2): 2 g via INTRAVENOUS
  Filled 2018-08-17 (×2): qty 100

## 2018-08-17 MED ORDER — LIDOCAINE 2% (20 MG/ML) 5 ML SYRINGE
INTRAMUSCULAR | Status: DC | PRN
  Administered 2018-08-17: 100 mg via INTRAVENOUS

## 2018-08-17 MED ORDER — PANTOPRAZOLE SODIUM 40 MG PO TBEC: 40.0000 mg | DELAYED_RELEASE_TABLET | Freq: Every day | ORAL | Status: DC | PRN

## 2018-08-17 MED ORDER — LORATADINE 10 MG PO TABS: 10.0000 mg | ORAL_TABLET | Freq: Every day | ORAL | Status: DC

## 2018-08-17 MED ORDER — ONDANSETRON HCL 4 MG/2ML IJ SOLN
INTRAMUSCULAR | Status: AC
  Filled 2018-08-17: qty 2

## 2018-08-17 MED ORDER — CHLORHEXIDINE GLUCONATE CLOTH 2 % EX PADS: 6.0000 | MEDICATED_PAD | Freq: Once | CUTANEOUS | Status: DC

## 2018-08-17 MED ORDER — METOCLOPRAMIDE HCL 5 MG/ML IJ SOLN
INTRAMUSCULAR | Status: AC
  Filled 2018-08-17: qty 2

## 2018-08-17 MED ORDER — SODIUM CHLORIDE 0.9 % IV SOLN
INTRAVENOUS | Status: DC | PRN
  Administered 2018-08-17: 500 mL

## 2018-08-17 MED ORDER — PROPOFOL 500 MG/50ML IV EMUL
INTRAVENOUS | Status: DC | PRN
  Administered 2018-08-17: 50 ug/kg/min via INTRAVENOUS

## 2018-08-17 MED ORDER — SUCCINYLCHOLINE CHLORIDE 200 MG/10ML IV SOSY
PREFILLED_SYRINGE | INTRAVENOUS | Status: AC
  Filled 2018-08-17: qty 10

## 2018-08-17 MED ORDER — MORPHINE SULFATE (PF) 4 MG/ML IV SOLN: 4.0000 mg | INTRAVENOUS | Status: DC | PRN

## 2018-08-17 MED ORDER — BUPIVACAINE-EPINEPHRINE (PF) 0.5% -1:200000 IJ SOLN
INTRAMUSCULAR | Status: DC | PRN
  Administered 2018-08-17: 10 mL

## 2018-08-17 MED ORDER — THROMBIN 20000 UNITS EX SOLR
CUTANEOUS | Status: AC
  Filled 2018-08-17: qty 20000

## 2018-08-17 MED ORDER — THROMBIN 5000 UNITS EX SOLR
CUTANEOUS | Status: AC
  Filled 2018-08-17: qty 5000

## 2018-08-17 MED ORDER — METOCLOPRAMIDE HCL 5 MG/ML IJ SOLN
INTRAMUSCULAR | Status: DC | PRN
  Administered 2018-08-17: 5 mg via INTRAVENOUS

## 2018-08-17 MED ORDER — DEXAMETHASONE SODIUM PHOSPHATE 10 MG/ML IJ SOLN
INTRAMUSCULAR | Status: AC
  Filled 2018-08-17: qty 1

## 2018-08-17 MED ORDER — PROMETHAZINE HCL 25 MG/ML IJ SOLN
INTRAMUSCULAR | Status: AC
  Filled 2018-08-17: qty 1

## 2018-08-17 MED ORDER — ACETAMINOPHEN 500 MG PO TABS: 1000.0000 mg | ORAL_TABLET | Freq: Four times a day (QID) | ORAL | Status: DC | PRN

## 2018-08-17 MED ORDER — BACITRACIN ZINC 500 UNIT/GM EX OINT
TOPICAL_OINTMENT | CUTANEOUS | Status: AC
  Filled 2018-08-17: qty 28.35

## 2018-08-17 MED ORDER — ROCURONIUM BROMIDE 10 MG/ML (PF) SYRINGE
PREFILLED_SYRINGE | INTRAVENOUS | Status: AC
  Filled 2018-08-17: qty 10

## 2018-08-17 MED ORDER — ONDANSETRON HCL 4 MG PO TABS: 4.0000 mg | ORAL_TABLET | Freq: Four times a day (QID) | ORAL | Status: DC | PRN

## 2018-08-17 MED ORDER — BACITRACIN ZINC 500 UNIT/GM EX OINT
TOPICAL_OINTMENT | CUTANEOUS | Status: DC | PRN
  Administered 2018-08-17: 1 via TOPICAL

## 2018-08-17 MED ORDER — SODIUM CHLORIDE 0.9 % IV SOLN: 250.0000 mL | INTRAVENOUS | Status: DC

## 2018-08-17 MED ORDER — SODIUM CHLORIDE 0.9% FLUSH
3.0000 mL | Freq: Two times a day (BID) | INTRAVENOUS | Status: DC
  Administered 2018-08-17 (×2): 3 mL via INTRAVENOUS

## 2018-08-17 MED ORDER — THROMBIN 5000 UNITS EX SOLR
OROMUCOSAL | Status: DC | PRN
  Administered 2018-08-17: 5 mL

## 2018-08-17 MED ORDER — ACETAMINOPHEN 10 MG/ML IV SOLN: 1000.0000 mg | Freq: Once | INTRAVENOUS | Status: DC | PRN

## 2018-08-17 MED ORDER — ACETAMINOPHEN 650 MG RE SUPP: 650.0000 mg | RECTAL | Status: DC | PRN

## 2018-08-17 MED ORDER — BUPIVACAINE LIPOSOME 1.3 % IJ SUSP
20.0000 mL | Freq: Once | INTRAMUSCULAR | Status: AC
  Administered 2018-08-17: 20 mL
  Filled 2018-08-17: qty 20

## 2018-08-17 MED ORDER — PHENOL 1.4 % MT LIQD: 1.0000 | OROMUCOSAL | Status: DC | PRN

## 2018-08-17 MED ORDER — DEXAMETHASONE SODIUM PHOSPHATE 10 MG/ML IJ SOLN
INTRAMUSCULAR | Status: DC | PRN
  Administered 2018-08-17: 10 mg via INTRAVENOUS

## 2018-08-17 MED ORDER — IRBESARTAN 75 MG PO TABS
75.0000 mg | ORAL_TABLET | Freq: Every day | ORAL | Status: DC
  Administered 2018-08-17: 75 mg via ORAL
  Filled 2018-08-17 (×2): qty 1

## 2018-08-17 MED ORDER — PROPOFOL 1000 MG/100ML IV EMUL
INTRAVENOUS | Status: AC
  Filled 2018-08-17: qty 100

## 2018-08-17 MED ORDER — PROPOFOL 10 MG/ML IV BOLUS
INTRAVENOUS | Status: DC | PRN
  Administered 2018-08-17: 70 mg via INTRAVENOUS

## 2018-08-17 MED ORDER — CEFAZOLIN SODIUM-DEXTROSE 2-4 GM/100ML-% IV SOLN
2.0000 g | INTRAVENOUS | Status: AC
  Administered 2018-08-17: 2 g via INTRAVENOUS
  Filled 2018-08-17: qty 100

## 2018-08-17 MED ORDER — ACETAMINOPHEN 500 MG PO TABS
1000.0000 mg | ORAL_TABLET | Freq: Four times a day (QID) | ORAL | Status: AC
  Administered 2018-08-17 – 2018-08-18 (×3): 1000 mg via ORAL
  Filled 2018-08-17 (×3): qty 2

## 2018-08-17 MED ORDER — CYCLOBENZAPRINE HCL 10 MG PO TABS: 10.0000 mg | ORAL_TABLET | Freq: Three times a day (TID) | ORAL | Status: DC | PRN

## 2018-08-17 MED ORDER — SUGAMMADEX SODIUM 200 MG/2ML IV SOLN
INTRAVENOUS | Status: DC | PRN
  Administered 2018-08-17: 170 mg via INTRAVENOUS

## 2018-08-17 MED ORDER — BUPIVACAINE-EPINEPHRINE (PF) 0.5% -1:200000 IJ SOLN
INTRAMUSCULAR | Status: AC
  Filled 2018-08-17: qty 30

## 2018-08-17 MED ORDER — ZOLPIDEM TARTRATE 5 MG PO TABS: 5.0000 mg | ORAL_TABLET | Freq: Every evening | ORAL | Status: DC | PRN

## 2018-08-17 MED ORDER — ACETAMINOPHEN 325 MG PO TABS
650.0000 mg | ORAL_TABLET | ORAL | Status: DC | PRN
  Administered 2018-08-18: 650 mg via ORAL
  Filled 2018-08-17: qty 2

## 2018-08-17 MED ORDER — FENTANYL CITRATE (PF) 100 MCG/2ML IJ SOLN
INTRAMUSCULAR | Status: DC | PRN
  Administered 2018-08-17 (×2): 50 ug via INTRAVENOUS
  Administered 2018-08-17 (×2): 25 ug via INTRAVENOUS
  Administered 2018-08-17 (×2): 50 ug via INTRAVENOUS

## 2018-08-17 MED ORDER — LEVOTHYROXINE SODIUM 75 MCG PO TABS
75.0000 ug | ORAL_TABLET | Freq: Every day | ORAL | Status: DC
  Administered 2018-08-18: 75 ug via ORAL
  Filled 2018-08-17: qty 3

## 2018-08-17 MED ORDER — SUCCINYLCHOLINE CHLORIDE 20 MG/ML IJ SOLN
INTRAMUSCULAR | Status: DC | PRN
  Administered 2018-08-17: 100 mg via INTRAVENOUS

## 2018-08-17 MED ORDER — HYDROMORPHONE HCL 2 MG PO TABS: 2.0000 mg | ORAL_TABLET | ORAL | Status: DC | PRN

## 2018-08-17 MED ORDER — MENTHOL 3 MG MT LOZG: 1.0000 | LOZENGE | OROMUCOSAL | Status: DC | PRN

## 2018-08-17 MED ORDER — ONDANSETRON HCL 4 MG/2ML IJ SOLN: 4.0000 mg | Freq: Four times a day (QID) | INTRAMUSCULAR | Status: DC | PRN

## 2018-08-17 SURGICAL SUPPLY — 69 items
ADH SKN CLS APL DERMABOND .7 (GAUZE/BANDAGES/DRESSINGS) ×1
APL SKNCLS STERI-STRIP NONHPOA (GAUZE/BANDAGES/DRESSINGS) ×1
BAG DECANTER FOR FLEXI CONT (MISCELLANEOUS) ×2 IMPLANT
BENZOIN TINCTURE PRP APPL 2/3 (GAUZE/BANDAGES/DRESSINGS) ×2 IMPLANT
BLADE CLIPPER SURG (BLADE) IMPLANT
BUR MATCHSTICK NEURO 3.0 LAGG (BURR) ×2 IMPLANT
BUR PRECISION FLUTE 6.0 (BURR) ×2 IMPLANT
CAGE ALTERA 10X31X9-13 15D (Cage) ×1 IMPLANT
CANISTER SUCT 3000ML PPV (MISCELLANEOUS) ×2 IMPLANT
CAP REVERE LOCKING (Cap) ×8 IMPLANT
CARTRIDGE OIL MAESTRO DRILL (MISCELLANEOUS) ×1 IMPLANT
CONT SPEC 4OZ CLIKSEAL STRL BL (MISCELLANEOUS) ×2 IMPLANT
COVER BACK TABLE 60X90IN (DRAPES) ×2 IMPLANT
COVER WAND RF STERILE (DRAPES) ×2 IMPLANT
DECANTER SPIKE VIAL GLASS SM (MISCELLANEOUS) ×2 IMPLANT
DERMABOND ADVANCED (GAUZE/BANDAGES/DRESSINGS) ×1
DERMABOND ADVANCED .7 DNX12 (GAUZE/BANDAGES/DRESSINGS) IMPLANT
DIFFUSER DRILL AIR PNEUMATIC (MISCELLANEOUS) ×2 IMPLANT
DRAPE C-ARM 42X72 X-RAY (DRAPES) ×5 IMPLANT
DRAPE HALF SHEET 40X57 (DRAPES) ×3 IMPLANT
DRAPE LAPAROTOMY 100X72X124 (DRAPES) ×2 IMPLANT
DRAPE SURG 17X23 STRL (DRAPES) ×8 IMPLANT
DRSG OPSITE POSTOP 4X6 (GAUZE/BANDAGES/DRESSINGS) ×1 IMPLANT
ELECT BLADE 4.0 EZ CLEAN MEGAD (MISCELLANEOUS) ×2
ELECT REM PT RETURN 9FT ADLT (ELECTROSURGICAL) ×2
ELECTRODE BLDE 4.0 EZ CLN MEGD (MISCELLANEOUS) ×1 IMPLANT
ELECTRODE REM PT RTRN 9FT ADLT (ELECTROSURGICAL) ×1 IMPLANT
EVACUATOR 1/8 PVC DRAIN (DRAIN) IMPLANT
GAUZE 4X4 16PLY RFD (DISPOSABLE) ×2 IMPLANT
GAUZE SPONGE 4X4 12PLY STRL (GAUZE/BANDAGES/DRESSINGS) ×2 IMPLANT
GLOVE BIO SURGEON STRL SZ8 (GLOVE) ×4 IMPLANT
GLOVE BIO SURGEON STRL SZ8.5 (GLOVE) ×4 IMPLANT
GLOVE BIOGEL PI IND STRL 8 (GLOVE) IMPLANT
GLOVE BIOGEL PI INDICATOR 8 (GLOVE) ×1
GLOVE ECLIPSE 7.5 STRL STRAW (GLOVE) ×2 IMPLANT
GLOVE EXAM NITRILE XL STR (GLOVE) IMPLANT
GOWN STRL REUS W/ TWL LRG LVL3 (GOWN DISPOSABLE) IMPLANT
GOWN STRL REUS W/ TWL XL LVL3 (GOWN DISPOSABLE) ×2 IMPLANT
GOWN STRL REUS W/TWL 2XL LVL3 (GOWN DISPOSABLE) ×1 IMPLANT
GOWN STRL REUS W/TWL LRG LVL3 (GOWN DISPOSABLE)
GOWN STRL REUS W/TWL XL LVL3 (GOWN DISPOSABLE) ×4
HEMOSTAT POWDER KIT SURGIFOAM (HEMOSTASIS) ×2 IMPLANT
KIT BASIN OR (CUSTOM PROCEDURE TRAY) ×2 IMPLANT
KIT TURNOVER KIT B (KITS) ×2 IMPLANT
MILL MEDIUM DISP (BLADE) ×2 IMPLANT
NDL HYPO 21X1.5 SAFETY (NEEDLE) IMPLANT
NEEDLE HYPO 21X1.5 SAFETY (NEEDLE) IMPLANT
NEEDLE HYPO 22GX1.5 SAFETY (NEEDLE) ×2 IMPLANT
NS IRRIG 1000ML POUR BTL (IV SOLUTION) ×2 IMPLANT
OIL CARTRIDGE MAESTRO DRILL (MISCELLANEOUS) ×2
PACK LAMINECTOMY NEURO (CUSTOM PROCEDURE TRAY) ×2 IMPLANT
PAD ARMBOARD 7.5X6 YLW CONV (MISCELLANEOUS) ×6 IMPLANT
PATTIES SURGICAL .5 X1 (DISPOSABLE) IMPLANT
PUTTY DBM 10CC CALC GRAN (Putty) ×1 IMPLANT
ROD CURVED REVERE 6.35X90MM (Rod) ×2 IMPLANT
SCREW 7.5X50MM (Screw) ×2 IMPLANT
SPONGE LAP 4X18 RFD (DISPOSABLE) IMPLANT
SPONGE NEURO XRAY DETECT 1X3 (DISPOSABLE) IMPLANT
SPONGE SURGIFOAM ABS GEL 100 (HEMOSTASIS) IMPLANT
STRIP CLOSURE SKIN 1/2X4 (GAUZE/BANDAGES/DRESSINGS) ×2 IMPLANT
STYLET INTUB SATIN SLIP 14FR (MISCELLANEOUS) ×1 IMPLANT
SUT VIC AB 1 CT1 18XBRD ANBCTR (SUTURE) ×2 IMPLANT
SUT VIC AB 1 CT1 8-18 (SUTURE) ×4
SUT VIC AB 2-0 CP2 18 (SUTURE) ×4 IMPLANT
SYR 20CC LL (SYRINGE) IMPLANT
TOWEL GREEN STERILE (TOWEL DISPOSABLE) ×2 IMPLANT
TOWEL GREEN STERILE FF (TOWEL DISPOSABLE) ×2 IMPLANT
TRAY FOLEY MTR SLVR 16FR STAT (SET/KITS/TRAYS/PACK) ×2 IMPLANT
WATER STERILE IRR 1000ML POUR (IV SOLUTION) ×2 IMPLANT

## 2018-08-17 NOTE — Transfer of Care (Signed)
Immediate Anesthesia Transfer of Care Note  Patient: Othella Boyer Grams  Procedure(s) Performed: POSTERIOR LUMBAR INTERBODY FUSION, INTERBODY PROSTHESIS, POSTERIOR INSTRUMENTATION LUMBAR 3- LUMBAR 4; EXPLORE LUMBAR FUSION (N/A Spine Lumbar)  Patient Location: PACU  Anesthesia Type:General  Level of Consciousness: awake and oriented  Airway & Oxygen Therapy: Patient Spontanous Breathing and Patient connected to nasal cannula oxygen  Post-op Assessment: Report given to RN and Patient moving all extremities X 4  Post vital signs: Reviewed and stable  Last Vitals:  Vitals Value Taken Time  BP 148/76 08/17/18 1149  Temp    Pulse 71 08/17/18 1150  Resp 13 08/17/18 1150  SpO2 93 % 08/17/18 1150  Vitals shown include unvalidated device data.  Last Pain:  Vitals:   08/17/18 0735  TempSrc:   PainSc: 0-No pain      Patients Stated Pain Goal: 4 (64/38/38 1840)  Complications: No apparent anesthesia complications

## 2018-08-17 NOTE — Op Note (Signed)
Brief history: The patient is an 85 year old white female on whom I performed an 4 5 and L5-S1 decompression, instrumentation and fusion with a few years ago.  She has done well until recently when she developed recurrent back and leg pain.  She failed medical management.  She was worked up with a lumbar MRI and lumbar x-rays which demonstrated severe adjacent segment disease with spondylolisthesis and spinal stenosis.  I discussed the various treatment options with her.  She has decided to proceed with surgery after weighing the risks, benefits and alternatives.  Preoperative diagnosis: Lumbar adjacent disease with spondylolisthesis, degenerative disc disease, spinal stenosis compressing both the L3 and the L4 nerve roots; lumbago; lumbar radiculopathy; neurogenic claudication  Postoperative diagnosis: The same  Procedure: Bilateral L3-4 laminotomy/foraminotomies/medial facetectomy to decompress the bilateral L3 and L4 nerve roots(the work required to do this was in addition to the work required to do the posterior lumbar interbody fusion because of the patient's spinal stenosis, facet arthropathy. Etc. requiring a wide decompression of the nerve roots.);  L3-4 transforaminal lumbar interbody fusion with local morselized autograft bone and Zimmer DBM; insertion of interbody prosthesis at L3-4 (globus peek expandable interbody prosthesis); posterior segmental instrumentation from L3 to S1 with globus titanium pedicle screws and rods; posterior lateral arthrodesis at L3-4 with local morselized autograft bone and Zimmer DBM; exploration of lumbar fusion/removal of lumbar hardware  Surgeon: Dr. Earle Gell  Asst.: Negative Reinaldo Meeker nurse practitioner  Anesthesia: Gen. endotracheal  Estimated blood loss: 250 cc  Drains: None  Complications: None  Description of procedure: The patient was brought to the operating room by the anesthesia team. General endotracheal anesthesia was induced. The patient was  turned to the prone position on the Wilson frame. The patient's lumbosacral region was then prepared with Betadine scrub and Betadine solution. Sterile drapes were applied.  I then injected the area to be incised with Marcaine with epinephrine solution. I then used the scalpel to make a linear midline incision over the L3-4, L4-5 and L5-S1 interspace, incising through the old surgical scar. I then used electrocautery to perform a bilateral subperiosteal dissection exposing the spinous process and lamina of 3, L4, L5 and the upper sacrum, and exposing the old hardware at L3-4 and L4-5 bilaterally. We then inserted the Verstrac retractor to provide exposure.  We explored the fusion by removing the caps from the old screws, removing the rod and then inspect the arthrodesis.  It appeared solid at L4-5 and L5-S1.  I began the decompression by using the high speed drill to perform laminotomies at L3-4 bilaterally. We then used the Kerrison punches to widen the laminotomy and removed the ligamentum flavum at L3-4 bilaterally. We used the Kerrison punches to remove the medial facets at L3-4 bilaterally. We performed wide foraminotomies about the bilateral L3 and L4 nerve roots completing the decompression.  We now turned our attention to the posterior lumbar interbody fusion. I used a scalpel to incise the intervertebral disc at L3-4 bilaterally. I then performed a partial intervertebral discectomy at L3-4 bilaterally using the pituitary forceps. We prepared the vertebral endplates at O1-7 bilaterally for the fusion by removing the soft tissues with the curettes. We then used the trial spacers to pick the appropriate sized interbody prosthesis. We prefilled his prosthesis with a combination of local morselized autograft bone that we obtained during the decompression as well as Zimmer DBM. We inserted the prefilled prosthesis into the interspace at L3-4, we then turned and expanded the prosthesis. There was a  good  snug fit of the prosthesis in the interspace. We then filled and the remainder of the intervertebral disc space with local morselized autograft bone and Zimmer DBM. This completed the posterior lumbar interbody arthrodesis.  We now turned attention to the instrumentation. Under fluoroscopic guidance we cannulated the bilateral L3 pedicles with the bone probe. We then removed the bone probe. We then tapped the pedicle with a 6.5 millimeter tap. We then removed the tap. We probed inside the tapped pedicle with a ball probe to rule out cortical breaches. We then inserted a 7.5 x 50 millimeter pedicle screw into the L3 pedicles bilaterally under fluoroscopic guidance. We then palpated along the medial aspect of the pedicles to rule out cortical breaches. There were none. The nerve roots were not injured. We then connected the unilateral pedicle screws from L3-S1 bilaterally with a lordotic rod. We compressed the construct and secured the rod in place with the caps. We then tightened the caps appropriately. This completed the instrumentation from L3-S1 bilaterally.  We now turned our attention to the posterior lateral arthrodesis at L3-4. We used the high-speed drill to decorticate the remainder of the facets, pars, transverse process at L3-4. We then applied a combination of local morselized autograft bone and Zimmer DBM over these decorticated posterior lateral structures. This completed the posterior lateral arthrodesis.  We then obtained hemostasis using bipolar electrocautery. We irrigated the wound out with bacitracin solution. We inspected the thecal sac and nerve roots and noted they were well decompressed. We then removed the retractor. We placed vancomycin powder in the wound.  We injected Exparel . We reapproximated patient's thoracolumbar fascia with interrupted #1 Vicryl suture. We reapproximated patient's subcutaneous tissue with interrupted 2-0 Vicryl suture. The reapproximated patient's skin with  Steri-Strips and benzoin. The wound was then coated with bacitracin ointment. A sterile dressing was applied. The drapes were removed. The patient was subsequently returned to the supine position where they were extubated by the anesthesia team. He was then transported to the post anesthesia care unit in stable condition. All sponge instrument and needle counts were reportedly correct at the end of this case.

## 2018-08-17 NOTE — Progress Notes (Signed)
Subjective: The patient is somnolent but easily arousable.  She is in no apparent distress.  She looks well.  Objective: Vital signs in last 24 hours: Temp:  [96.9 F (36.1 C)-97.9 F (36.6 C)] 96.9 F (36.1 C) (06/25 1150) Pulse Rate:  [68-87] 68 (06/25 1204) Resp:  [13-19] 13 (06/25 1204) BP: (146-171)/(72-83) 146/72 (06/25 1204) SpO2:  [91 %-99 %] 98 % (06/25 1204) Weight:  [84.7 kg] 84.7 kg (06/25 0727) Estimated body mass index is 31.07 kg/m as calculated from the following:   Height as of this encounter: 5\' 5"  (1.651 m).   Weight as of this encounter: 84.7 kg.   Intake/Output from previous day: No intake/output data recorded. Intake/Output this shift: Total I/O In: 1500 [I.V.:1500] Out: 425 [Urine:325; Blood:100]  Physical exam the patient is somnolent but arousable.  She is moving her lower extremities well.  Lab Results: No results for input(s): WBC, HGB, HCT, PLT in the last 72 hours. BMET No results for input(s): NA, K, CL, CO2, GLUCOSE, BUN, CREATININE, CALCIUM in the last 72 hours.  Studies/Results: No results found.  Assessment/Plan: The patient is doing well.  I spoke with her husband.  LOS: 0 days     Ophelia Charter 08/17/2018, 12:09 PM

## 2018-08-17 NOTE — H&P (Signed)
Subjective: The patient is an 85 year old white female on whom I performed an L4-5 and L5-S1 instrumentation and fusion.  She did well initially but has developed recurrent back and leg pain.  She has failed medical management.  She was worked up with lumbar x-rays and lumbar MRI which demonstrated lumbar adjacent disease with spondylolisthesis at L3-4.  I discussed the various treatment options with her.  She has decided to proceed with surgery.  Past Medical History:  Diagnosis Date  . Arthritis   . GERD (gastroesophageal reflux disease)   . History of hiatal hernia   . Hypertension   . Hypothyroidism   . Lumbar adjacent segment disease with spondylolisthesis   . PONV (postoperative nausea and vomiting)   . Pulmonary embolism ([place])    after hysterectomy in 1988  . Seasonal allergies   . Thyroid disease     Past Surgical History:  Procedure Laterality Date  . ABDOMINAL HYSTERECTOMY    . BACK SURGERY    . BREAST EXCISIONAL BIOPSY Right 10+ years   benign  . BREAST SURGERY Right 1961  . BREAST SURGERY Left 1996   tumor  . CATARACT EXTRACTION W/ INTRAOCULAR LENS IMPLANT Bilateral   . COLONOSCOPY    . COLONOSCOPY W/ BIOPSIES AND POLYPECTOMY    . GANGLION CYST EXCISION      Allergies  Allergen Reactions  . Codeine Hives and Itching  . Statins     Muscle pain  . Zetia [Ezetimibe]     Muscle Pain  . Celebrex [Celecoxib] Palpitations    Social History   Tobacco Use  . Smoking status: Never Smoker  . Smokeless tobacco: Never Used  Substance Use Topics  . Alcohol use: Yes    Comment: glass of wine occasionally    Family History  Problem Relation Age of Onset  . Cancer - Other Mother        duodenum cancer  . Colon cancer Neg Hx    Prior to Admission medications   Medication Sig Start Date End Date Taking? Authorizing Provider  acetaminophen (TYLENOL) 500 MG tablet Take 1,000 mg by mouth every 6 (six) hours as needed for moderate pain.   Yes [provider]   Calcium Carb-Cholecalciferol (CALCIUM 600+D) 600-800 MG-UNIT TABS Take 1 tablet by mouth daily.    Yes [provider]  fexofenadine (ALLEGRA) 180 MG tablet Take 180 mg by mouth daily.   Yes [provider]  irbesartan (AVAPRO) 75 MG tablet Take 75 mg by mouth daily.   Yes [provider]  levothyroxine (SYNTHROID, LEVOTHROID) 75 MCG tablet Take 75 mcg by mouth daily before breakfast.   Yes [provider]  metoprolol succinate (TOPROL-XL) 25 MG 24 hr tablet Take 25 mg by mouth daily.   Yes [provider]  mometasone (NASONEX) 50 MCG/ACT nasal spray Place 2 sprays into the nose daily as needed (congestion).   Yes [provider]  aspirin 81 MG tablet Take 81 mg by mouth daily.    [provider]  cyclobenzaprine (FLEXERIL) 5 MG tablet Take 1 tablet (5 mg total) by mouth 3 (three) times daily as needed for muscle spasms. Patient not taking: Reported on 08/08/2018 05/21/17   Newman Pies, MD  EPINEPHrine 0.3 mg/0.3 mL IJ SOAJ injection Inject 0.3 mg into the muscle as needed for anaphylaxis.  03/23/18   [provider]  HYDROmorphone (DILAUDID) 2 MG tablet Take 1 tablet (2 mg total) by mouth every 4 (four) hours as needed for severe pain.  Patient not taking: Reported on 08/08/2018 05/21/17   Newman Pies, MD  Omega-3 Fatty Acids (FISH OIL) 1000 MG CAPS Take 1,000 mg by mouth daily.     [provider]  pantoprazole (PROTONIX) 40 MG tablet Take 40 mg by mouth daily as needed (acid reflux).     [provider]  Probiotic Product (PROBIOTIC DAILY PO) Take 1 capsule by mouth daily.    [provider]     Review of Systems  Positive ROS: As above  All other systems have been reviewed and were otherwise negative with the exception of those mentioned in the HPI and as above.  Objective: Vital signs in last 24 hours: Temp:  [97.9 F (36.6 C)] 97.9 F (36.6 C) (06/25 0727) Pulse Rate:  [87] 87  (06/25 0727) Resp:  [18] 18 (06/25 0727) BP: (171)/(83) 171/83 (06/25 0727) SpO2:  [97 %] 97 % (06/25 0727) Weight:  [84.7 kg] 84.7 kg (06/25 0727) Estimated body mass index is 31.07 kg/m as calculated from the following:   Height as of this encounter: 5\' 5"  (1.651 m).   Weight as of this encounter: 84.7 kg.   General Appearance: Alert Head: Normocephalic, without obvious abnormality, atraumatic Eyes: PERRL, conjunctiva/corneas clear, EOM's intact,    Ears: Normal  Throat: Normal  Neck: Supple, Back: The patient's lumbar incision is well-healed. Lungs: Clear to auscultation bilaterally, respirations unlabored Heart: Regular rate and rhythm, no murmur, rub or gallop Abdomen: Soft, non-tender Extremities: Extremities normal, atraumatic, no cyanosis or edema Skin: unremarkable  NEUROLOGIC:   Mental status: alert and oriented,Motor Exam - grossly normal Sensory Exam - grossly normal Reflexes:  Coordination - grossly normal Gait - grossly normal Balance - grossly normal Cranial Nerves: I: smell Not tested  II: visual acuity  OS: Normal  OD: Normal   II: visual fields Full to confrontation  II: pupils Equal, round, reactive to light  III,VII: ptosis None  III,IV,VI: extraocular muscles  Full ROM  V: mastication Normal  V: facial light touch sensation  Normal  V,VII: corneal reflex  Present  VII: facial muscle function - upper  Normal  VII: facial muscle function - lower Normal  VIII: hearing Not tested  IX: soft palate elevation  Normal  IX,X: gag reflex Present  XI: trapezius strength  5/5  XI: sternocleidomastoid strength 5/5  XI: neck flexion strength  5/5  XII: tongue strength  Normal    Data Review Lab Results  Component Value Date   WBC 8.6 08/09/2018   HGB 12.7 08/09/2018   HCT 40.3 08/09/2018   MCV 88.2 08/09/2018   PLT 237 08/09/2018   Lab Results  Component Value Date   NA 141 08/09/2018   K 4.0 08/09/2018   CL 106 08/09/2018   CO2 25 08/09/2018    BUN 25 (H) 08/09/2018   CREATININE 1.00 08/09/2018   GLUCOSE 107 (H) 08/09/2018   No results found for: INR, PROTIME  Assessment/Plan: L3-4 adjacent disease with spondylolisthesis and spinal stenosis, lumbago, lumbar radiculopathy, neurogenic claudication: I have discussed the situation with the patient.  I reviewed her imaging studies with her and pointed out the abnormalities.  We have discussed the various treatment options including surgery.  I have described the surgical treatment option of an exploration of her lumbar fusion with an L3-4 decompression, instrumentation and fusion.  I have shown her surgical models.  I have given her a surgical pamphlet.  We have discussed the risks, benefits, alternatives, expected postoperative course, and likelihood of  achieving our goals with surgery.  I have answered all her questions.  She has decided to proceed with surgery.   Ophelia Charter 08/17/2018 7:52 AM

## 2018-08-17 NOTE — Anesthesia Preprocedure Evaluation (Signed)
Anesthesia Evaluation  Patient identified by MRN, date of birth, ID band Patient awake    Reviewed: Allergy & Precautions, NPO status , Patient's Chart, lab work & pertinent test results  Airway Mallampati: II  TM Distance: >3 FB Neck ROM: Full    Dental no notable dental hx.    Pulmonary neg pulmonary ROS,    Pulmonary exam normal breath sounds clear to auscultation       Cardiovascular hypertension, Normal cardiovascular exam Rhythm:Regular Rate:Normal     Neuro/Psych negative neurological ROS  negative psych ROS   GI/Hepatic Neg liver ROS, GERD  ,  Endo/Other  Hypothyroidism   Renal/GU negative Renal ROS  negative genitourinary   Musculoskeletal negative musculoskeletal ROS (+)   Abdominal   Peds negative pediatric ROS (+)  Hematology negative hematology ROS (+)   Anesthesia Other Findings   Reproductive/Obstetrics negative OB ROS                             Anesthesia Physical Anesthesia Plan  ASA: II  Anesthesia Plan: General   Post-op Pain Management:    Induction: Intravenous  PONV Risk Score and Plan: 3 and Ondansetron, Dexamethasone and Treatment may vary due to age or medical condition  Airway Management Planned: Oral ETT  Additional Equipment:   Intra-op Plan:   Post-operative Plan: Extubation in OR  Informed Consent: I have reviewed the patients History and Physical, chart, labs and discussed the procedure including the risks, benefits and alternatives for the proposed anesthesia with the patient or authorized representative who has indicated his/her understanding and acceptance.     Dental advisory given  Plan Discussed with: CRNA and Surgeon  Anesthesia Plan Comments:         Anesthesia Quick Evaluation

## 2018-08-17 NOTE — Evaluation (Signed)
Physical Therapy Evaluation Patient Details Name: Kyli Sorter MRN: [id number] DOB: [birth year] Today's Date: 08/17/2018   History of Present Illness  pt is an 85 y/o female with pmh significant for HTN, spondylolisthesis, PE, previous Lumbar fusion, admitted with worsening recurrent back and leg pain, s/p bil L3/4 lami/foraminotomies/facetectomies to decompress bil L3 and L4 plus L3/4 TLIF with grafting.  Clinical Impression  Pt admitted with/for lumbar fusion surgery.  Pt generally at a min guard to min assist level.  Pt currently limited functionally due to the problems listed below.  (see problems list.)  Pt will benefit from PT to maximize function and safety to be able to get home safely with available assist.     Follow Up Recommendations No PT follow up;Supervision/Assistance - 24 hour    Equipment Recommendations  None recommended by PT    Recommendations for Other Services       Precautions / Restrictions Precautions Precautions: Back Required Braces or Orthoses: Spinal Brace Spinal Brace: Lumbar corset;Applied in sitting position      Mobility  Bed Mobility Overal bed mobility: Needs Assistance Bed Mobility: Rolling;Sidelying to Sit;Sit to Sidelying Rolling: Min assist Sidelying to sit: Min assist     Sit to sidelying: Min assist General bed mobility comments: cues and assist to safely log roll and come up via L elbow  Transfers Overall transfer level: Needs assistance   Transfers: Sit to/from Stand Sit to Stand: Min guard            Ambulation/Gait Ambulation/Gait assistance: Min guard Gait Distance (Feet): 200 Feet Assistive device: None Gait Pattern/deviations: Step-through pattern Gait velocity: slower Gait velocity interpretation: 1.31 - 2.62 ft/sec, indicative of limited community ambulator General Gait Details: steady, but a little guarded  Financial trader Rankin (Stroke Patients Only)        Balance Overall balance assessment: No apparent balance deficits (not formally assessed)                                           Pertinent Vitals/Pain Pain Assessment: Faces Faces Pain Scale: Hurts a little bit Pain Location: incisional discomfort Pain Descriptors / Indicators: Discomfort Pain Intervention(s): Monitored during session    Home Living Family/patient expects to be discharged to:: Private residence Living Arrangements: Spouse/significant other;Children Available Help at Discharge: Available 24 hours/day(dtr her until 7/1) Type of Home: House Home Access: Stairs to enter     Home Layout: One level Home Equipment: Bedside commode      Prior Function Level of Independence: Independent               Hand Dominance        Extremity/Trunk Assessment   Upper Extremity Assessment Upper Extremity Assessment: Overall WFL for tasks assessed    Lower Extremity Assessment Lower Extremity Assessment: Overall WFL for tasks assessed       Communication   Communication: No difficulties  Cognition Arousal/Alertness: Awake/alert Behavior During Therapy: WFL for tasks assessed/performed Overall Cognitive Status: Within Functional Limits for tasks assessed                                        General Comments General comments (skin integrity, edema, etc.): pt instructed  in back care/prec, log roll/transition to/from sidelying, bracing issues, lifting precautions and progression of activity.    Exercises     Assessment/Plan    PT Assessment Patient needs continued PT services  PT Problem List Decreased strength;Decreased activity tolerance;Decreased mobility;Decreased knowledge of precautions;Pain       PT Treatment Interventions Gait training;Stair training;Functional mobility training;Therapeutic activities;Balance training;Patient/family education    PT Goals (Current goals can be found in the Care Plan section)   Acute Rehab PT Goals Patient Stated Goal: home soon Time For Goal Achievement: 08/24/18 Potential to Achieve Goals: Good    Frequency Min 5X/week   Barriers to discharge        Co-evaluation               AM-PAC PT "6 Clicks" Mobility  Outcome Measure Help needed turning from your back to your side while in a flat bed without using bedrails?: A Little Help needed moving from lying on your back to sitting on the side of a flat bed without using bedrails?: A Little Help needed moving to and from a bed to a chair (including a wheelchair)?: A Little Help needed standing up from a chair using your arms (e.g., wheelchair or bedside chair)?: A Little Help needed to walk in hospital room?: A Little Help needed climbing 3-5 steps with a railing? : A Little 6 Click Score: 18    End of Session   Activity Tolerance: Patient tolerated treatment well Patient left: in bed;with call bell/phone within reach Nurse Communication: Mobility status PT Visit Diagnosis: Other abnormalities of gait and mobility (R26.89)    Time: 4562-5638 PT Time Calculation (min) (ACUTE ONLY): 24 min   Charges:   PT Evaluation $PT Eval Low Complexity: 1 Low PT Treatments $Gait Training: 8-22 mins        08/17/2018  Donnella Sham, PT Acute Rehabilitation Services [phone]  (pager) [phone]  (office)  Tessie Fass Prudy Candy 08/17/2018, 6:50 PM

## 2018-08-17 NOTE — Progress Notes (Signed)
Orthopedic Tech Progress Note Patient Details:  Debbie Taylor 04/27/1933 [id number] RN on duty said patient has back brace.  Patient ID: Debbie Taylor, female   DOB: Jun 11, 1933, 85 y.o.   MRN: [id number]   Janit Pagan 08/17/2018, 2:08 PM

## 2018-08-17 NOTE — Anesthesia Postprocedure Evaluation (Signed)
Anesthesia Post Note  Patient: Othella Boyer Cantave  Procedure(s) Performed: POSTERIOR LUMBAR INTERBODY FUSION, INTERBODY PROSTHESIS, POSTERIOR INSTRUMENTATION LUMBAR 3- LUMBAR 4; EXPLORE LUMBAR FUSION (N/A Spine Lumbar)     Patient location during evaluation: PACU Anesthesia Type: General Level of consciousness: awake and alert Pain management: pain level controlled Vital Signs Assessment: post-procedure vital signs reviewed and stable Respiratory status: spontaneous breathing, nonlabored ventilation, respiratory function stable and patient connected to nasal cannula oxygen Cardiovascular status: blood pressure returned to baseline and stable Postop Assessment: no apparent nausea or vomiting Anesthetic complications: no    Last Vitals:  Vitals:   08/17/18 1304 08/17/18 1319  BP: (!) 141/64 (!) 145/68  Pulse: 60 64  Resp: 19 (!) 25  Temp:    SpO2: 93% 96%    Last Pain:  Vitals:   08/17/18 1215  TempSrc:   PainSc: 0-No pain                 Landis Dowdy S

## 2018-08-17 NOTE — Anesthesia Procedure Notes (Signed)

## 2018-08-18 LAB — BASIC METABOLIC PANEL
Anion gap: 11 (ref 5–15)
BUN: 21 mg/dL (ref 8–23)
CO2: 23 mmol/L (ref 22–32)
Calcium: 8.4 mg/dL — ABNORMAL LOW (ref 8.9–10.3)
Chloride: 106 mmol/L (ref 98–111)
Creatinine, Ser: 1.11 mg/dL — ABNORMAL HIGH (ref 0.44–1.00)
GFR calc Af Amer: 54 mL/min — ABNORMAL LOW (ref >60)
GFR calc non Af Amer: 46 mL/min — ABNORMAL LOW (ref >60)
Glucose, Bld: 141 mg/dL — ABNORMAL HIGH (ref 70–99)
Potassium: 3.9 mmol/L (ref 3.5–5.1)
Sodium: 140 mmol/L (ref 135–145)

## 2018-08-18 LAB — CBC
HCT: 32 % — ABNORMAL LOW (ref 36.0–46.0)
Hemoglobin: 10.1 g/dL — ABNORMAL LOW (ref 12.0–15.0)
MCH: 27.7 pg (ref 26.0–34.0)
MCHC: 31.6 g/dL (ref 30.0–36.0)
MCV: 87.9 fL (ref 80.0–100.0)
Platelets: 232 10*3/uL (ref 150–400)
RBC: 3.64 MIL/uL — ABNORMAL LOW (ref 3.87–5.11)
RDW: 18.8 % — ABNORMAL HIGH (ref 11.5–15.5)
WBC: 12.8 10*3/uL — ABNORMAL HIGH (ref 4.0–10.5)
nRBC: 0 % (ref 0.0–0.2)

## 2018-08-18 MED ORDER — DOCUSATE SODIUM 100 MG PO CAPS: 100.0000 mg | ORAL_CAPSULE | Freq: Two times a day (BID) | ORAL | 0 refills | Status: DC

## 2018-08-18 MED ORDER — CYCLOBENZAPRINE HCL 5 MG PO TABS: 5.0000 mg | ORAL_TABLET | Freq: Three times a day (TID) | ORAL | Status: DC | PRN

## 2018-08-18 NOTE — Discharge Summary (Signed)
Physician Discharge Summary  Patient ID: Debbie Taylor MRN: [id number] DOB/AGE: August 18, 1933 85 y.o.  Admit date: 08/17/2018 Discharge date: 08/18/2018  Admission Diagnoses: Lumbar adjacent segment disease with spondylolisthesis and spinal stenosis, neurogenic claudication, lumbago, lumbar radiculopathy  Discharge Diagnoses: The same Active Problems:   Lumbar adjacent segment disease with spondylolisthesis   Discharged Condition: good  Hospital Course: I performed a L3-4 decompression, instrumentation and fusion on patient on 08/17/2018.  The surgery went well.  The patient's postoperative course was unremarkable.  On postoperative day #1 she requested discharge home.  She was taking Tylenol only for pain.  I gave her written and oral discharge instructions.  I have answered all her questions.  Consults: PT, OT, care management Significant Diagnostic Studies: None Treatments: Exploration of lumbar fusion, L3-4 decompression, instrumentation and fusion. Discharge Exam: Blood pressure (!) 103/54, pulse 62, temperature 98.2 F (36.8 C), temperature source Oral, resp. rate 18, height 5\' 5"  (1.651 m), weight 84.7 kg, SpO2 92 %. The patient is alert and pleasant.  She looks well.  Her strength is normal.  Disposition: Home  Discharge Instructions    Call MD for:  difficulty breathing, headache or visual disturbances   Complete by: As directed    Call MD for:  extreme fatigue   Complete by: As directed    Call MD for:  hives   Complete by: As directed    Call MD for:  persistant dizziness or light-headedness   Complete by: As directed    Call MD for:  persistant nausea and vomiting   Complete by: As directed    Call MD for:  redness, tenderness, or signs of infection (pain, swelling, redness, odor or green/yellow discharge around incision site)   Complete by: As directed    Call MD for:  severe uncontrolled pain   Complete by: As directed    Call MD for:  temperature >100.4    Complete by: As directed    Diet - low sodium heart healthy   Complete by: As directed    Discharge instructions   Complete by: As directed    Call [phone] for a followup appointment. Take a stool softener while you are using pain medications.   Driving Restrictions   Complete by: As directed    Do not drive for 2 weeks.   Increase activity slowly   Complete by: As directed    Lifting restrictions   Complete by: As directed    Do not lift more than 5 pounds. No excessive bending or twisting.   May shower / Bathe   Complete by: As directed    Remove the dressing for 3 days after surgery.  You may shower, but leave the incision alone.   Remove dressing in 48 hours   Complete by: As directed    Your stitches are under the scan and will dissolve by themselves. The Steri-Strips will fall off after you take a few showers. Do not rub back or pick at the wound, Leave the wound alone.     Allergies as of 08/18/2018      Reactions   Codeine Hives, Itching   Statins    Muscle pain   Zetia [ezetimibe]    Muscle Pain   Celebrex [celecoxib] Palpitations      Medication List    STOP taking these medications   acetaminophen 500 MG tablet Commonly known as: TYLENOL   aspirin 81 MG tablet   HYDROmorphone 2 MG tablet Commonly known as: DILAUDID  TAKE these medications   Calcium 600+D 600-800 MG-UNIT Tabs Generic drug: Calcium Carb-Cholecalciferol Take 1 tablet by mouth daily.   cyclobenzaprine 5 MG tablet Commonly known as: FLEXERIL Take 1 tablet (5 mg total) by mouth 3 (three) times daily as needed for muscle spasms.   docusate sodium 100 MG capsule Commonly known as: COLACE Take 1 capsule (100 mg total) by mouth 2 (two) times daily.   EPINEPHrine 0.3 mg/0.3 mL Soaj injection Commonly known as: EPI-PEN Inject 0.3 mg into the muscle as needed for anaphylaxis.   fexofenadine 180 MG tablet Commonly known as: ALLEGRA Take 180 mg by mouth daily.   Fish Oil 1000 MG  Caps Take 1,000 mg by mouth daily.   irbesartan 75 MG tablet Commonly known as: AVAPRO Take 75 mg by mouth daily.   levothyroxine 75 MCG tablet Commonly known as: SYNTHROID Take 75 mcg by mouth daily before breakfast.   metoprolol succinate 25 MG 24 hr tablet Commonly known as: TOPROL-XL Take 25 mg by mouth daily.   mometasone 50 MCG/ACT nasal spray Commonly known as: NASONEX Place 2 sprays into the nose daily as needed (congestion).   pantoprazole 40 MG tablet Commonly known as: PROTONIX Take 40 mg by mouth daily as needed (acid reflux).   PROBIOTIC DAILY PO Take 1 capsule by mouth daily.        Signed: Ophelia Charter 08/18/2018, 7:02 AM

## 2018-08-18 NOTE — Evaluation (Signed)
Occupational Therapy Evaluation and Discharge Patient Details Name: Debbie Taylor MRN: [id number] DOB: 04/09/1933 Today's Date: 08/18/2018    History of Present Illness pt is an 85 y/o female with pmh significant for HTN, spondylolisthesis, PE, previous Lumbar fusion, admitted with worsening recurrent back and leg pain, s/p bil L3/4 lami/foraminotomies/facetectomies to decompress bil L3 and L4 plus L3/4 TLIF with grafting.   Clinical Impression   This 85 yo female admitted and underwent above presents to acute OT with all education completed, we will D/C from acute OT.    Follow Up Recommendations  No OT follow up;Supervision - Intermittent    Equipment Recommendations  None recommended by OT       Precautions / Restrictions Precautions Precautions: Back Precaution Booklet Issued: Yes (comment) Precaution Comments: Pt recalling 3/3 precautions Required Braces or Orthoses: Spinal Brace Spinal Brace: Lumbar corset;Applied in sitting position Restrictions Weight Bearing Restrictions: No      Mobility Bed Mobility               General bed mobility comments: OOB in chair  Transfers Overall transfer level: Modified independent               General transfer comment: Increased time to achieve upright    Balance Overall balance assessment: No apparent balance deficits (not formally assessed)                                         ADL either performed or assessed with clinical judgement   ADL                                         General ADL Comments: Pt educated on sequence of dressing and crossing legs to get to feet for socks/shoes (she is able to do this). Educated on not sitting for more than 20-30 minutes at at time, using 2 cups for brushing teeth, and wet wipes for back peri care.     Vision Patient Visual Report: No change from baseline              Pertinent Vitals/Pain Pain Assessment: 0-10 Pain  Score: 2  Faces Pain Scale: Hurts a little bit Pain Location: incisional discomfort Pain Descriptors / Indicators: Sore Pain Intervention(s): Limited activity within patient's tolerance;Monitored during session     Hand Dominance Right   Extremity/Trunk Assessment Upper Extremity Assessment Upper Extremity Assessment: Overall WFL for tasks assessed           Communication Communication Communication: No difficulties   Cognition Arousal/Alertness: Awake/alert Behavior During Therapy: WFL for tasks assessed/performed Overall Cognitive Status: Within Functional Limits for tasks assessed                                                Home Living Family/patient expects to be discharged to:: Private residence Living Arrangements: Spouse/significant other;Children Available Help at Discharge: Available 24 hours/day Type of Home: House Home Access: Stairs to enter [employer] of Steps: 3   Home Layout: One level     Bathroom Shower/Tub: Occupational psychologist: Standard     Home Equipment: Bedside commode  Prior Functioning/Environment Level of Independence: Independent                 OT Problem List: Decreased strength;Decreased range of motion         OT Goals(Current goals can be found in the care plan section) Acute Rehab OT Goals Patient Stated Goal: home today  OT Frequency:                AM-PAC OT "6 Clicks" Daily Activity     Outcome Measure Help from another person eating meals?: None Help from another person taking care of personal grooming?: None Help from another person toileting, which includes using toliet, bedpan, or urinal?: None Help from another person bathing (including washing, rinsing, drying)?: A Little Help from another person to put on and taking off regular upper body clothing?: None Help from another person to put on and taking off regular lower body clothing?: A Little 6  Click Score: 22   End of Session Equipment Utilized During Treatment: Back brace  Activity Tolerance: Patient tolerated treatment well Patient left: (up in bathroom--brushing hair and teeth)  OT Visit Diagnosis: Pain Pain - Right/Left: (incisional)                Time: 7847-8412 OT Time Calculation (min): 22 min Charges:  OT General Charges $OT Visit: 1 Visit OT Evaluation $OT Eval Moderate Complexity: 1 Mod  [address], OTR/L Acute [employer] Pager [phone] Office [phone]     Almon Register 08/18/2018, 9:47 AM

## 2018-08-18 NOTE — Progress Notes (Signed)
Physical Therapy Treatment Patient Details Name: Debbie Taylor MRN: [id number] DOB: 05/19/1933 Today's Date: 08/18/2018    History of Present Illness pt is an 85 y/o female with pmh significant for HTN, spondylolisthesis, PE, previous Lumbar fusion, admitted with worsening recurrent back and leg pain, s/p bil L3/4 lami/foraminotomies/facetectomies to decompress bil L3 and L4 plus L3/4 TLIF with grafting.    PT Comments    Pt met her physical therapy goals during her inpatient stay. Ambulating hallway distances without difficulty. Negotiated 10 steps with right railing to simulate home set up. Recalling all spinal precautions; written handout provided. Education re: generalized walking program. Pt with no further questions/concerns. PT signing off.     Follow Up Recommendations  No PT follow up;Supervision/Assistance - 24 hour     Equipment Recommendations  None recommended by PT    Recommendations for Other Services       Precautions / Restrictions Precautions Precautions: Back Precaution Booklet Issued: Yes (comment) Precaution Comments: Pt recalling 3/3 precautions Required Braces or Orthoses: Spinal Brace Spinal Brace: Lumbar corset;Applied in sitting position Restrictions Weight Bearing Restrictions: No    Mobility  Bed Mobility               General bed mobility comments: OOB in chair  Transfers Overall transfer level: Modified independent               General transfer comment: Increased time to achieve upright  Ambulation/Gait Ambulation/Gait assistance: Modified independent (Device/Increase time) Gait Distance (Feet): 400 Feet Assistive device: None Gait Pattern/deviations: Step-through pattern Gait velocity: slower   General Gait Details: noted left foot inversion during midstance, no gross unsteadiness   Stairs Stairs: Yes Stairs assistance: Modified independent (Device/Increase time) Stair Management: One rail Right Number of  Stairs: 10 General stair comments: Step by step pattern   Wheelchair Mobility    Modified Rankin (Stroke Patients Only)       Balance Overall balance assessment: No apparent balance deficits (not formally assessed)                                          Cognition Arousal/Alertness: Awake/alert Behavior During Therapy: WFL for tasks assessed/performed Overall Cognitive Status: Within Functional Limits for tasks assessed                                        Exercises      General Comments        Pertinent Vitals/Pain Pain Assessment: Faces Faces Pain Scale: Hurts a little bit Pain Location: incisional discomfort Pain Descriptors / Indicators: Sore Pain Intervention(s): Monitored during session    Home Living                      Prior Function            PT Goals (current goals can now be found in the care plan section) Acute Rehab PT Goals Patient Stated Goal: home soon PT Goal Formulation: With patient Time For Goal Achievement: 08/24/18 Potential to Achieve Goals: Good Progress towards PT goals: Goals met/education completed, patient discharged from PT    Frequency    Min 5X/week      PT Plan Other (comment)(d/c therapies)    Co-evaluation                AM-PAC PT "6 Clicks" Mobility   Outcome Measure  Help needed turning from your back to your side while in a flat bed without using bedrails?: None Help needed moving from lying on your back to sitting on the side of a flat bed without using bedrails?: None Help needed moving to and from a bed to a chair (including a wheelchair)?: None Help needed standing up from a chair using your arms (e.g., wheelchair or bedside chair)?: None Help needed to walk in hospital room?: None Help needed climbing 3-5 steps with a railing? : None 6 Click Score: 24    End of Session Equipment Utilized During Treatment: Gait belt Activity Tolerance: Patient  tolerated treatment well Patient left: with call bell/phone within reach;in chair Nurse Communication: Mobility status PT Visit Diagnosis: Other abnormalities of gait and mobility (R26.89)     Time: 6578-4696 PT Time Calculation (min) (ACUTE ONLY): 16 min  Charges:  $Therapeutic Activity: 8-22 mins                     Ellamae Sia, PT, DPT Acute Rehabilitation Services Pager [phone] Office [phone]    Willy Eddy 08/18/2018, 8:50 AM

## 2018-08-18 NOTE — Plan of Care (Signed)
Patient alert and oriented, mae's well, voiding adequate amount of urine, swallowing without difficulty, no c/o pain at time of discharge. Patient discharged home with family. Script and discharged instructions given to patient. Patient and family stated understanding of instructions given. Patient has an appointment with Dr. Jenkins   

## 2018-08-21 MED FILL — Sodium Chloride IV Soln 0.9%: INTRAVENOUS | Qty: 1000 | Status: AC

## 2018-08-21 MED FILL — Heparin Sodium (Porcine) Inj 1000 Unit/ML: INTRAMUSCULAR | Qty: 30 | Status: AC

## 2019-01-29 ENCOUNTER — Other Ambulatory Visit: Payer: Self-pay | Admitting: Internal Medicine

## 2019-01-29 DIAGNOSIS — Z1231 Encounter for screening mammogram for malignant neoplasm of breast: Secondary | ICD-10-CM

## 2019-03-20 ENCOUNTER — Other Ambulatory Visit: Payer: Self-pay

## 2019-03-20 ENCOUNTER — Ambulatory Visit
Admission: RE | Admit: 2019-03-20 | Discharge: 2019-03-20 | Disposition: A | Discharge status: Home / Self Care | Payer: Medicare Other | Source: Ambulatory Visit | Attending: Internal Medicine | Admitting: Internal Medicine

## 2019-03-20 DIAGNOSIS — Z1231 Encounter for screening mammogram for malignant neoplasm of breast: Secondary | ICD-10-CM

## 2019-03-21 ENCOUNTER — Other Ambulatory Visit: Payer: Self-pay | Admitting: Internal Medicine

## 2019-03-21 DIAGNOSIS — N63 Unspecified lump in unspecified breast: Secondary | ICD-10-CM

## 2019-04-03 ENCOUNTER — Other Ambulatory Visit: Payer: Self-pay

## 2019-04-03 ENCOUNTER — Ambulatory Visit
Admission: RE | Admit: 2019-04-03 | Discharge: 2019-04-03 | Disposition: A | Discharge status: Home / Self Care | Payer: Medicare Other | Source: Ambulatory Visit | Attending: Internal Medicine | Admitting: Internal Medicine

## 2019-04-03 DIAGNOSIS — N63 Unspecified lump in unspecified breast: Secondary | ICD-10-CM

## 2019-11-02 IMAGING — RF DG LUMBAR SPINE 2-3V
1 series · 3 of 3 positions shown · non-contrast
Comparison: December 14, 2016

FLUOROSCOPY TIME:  0 minutes 37.4 seconds; 3 acquired images

CLINICAL DATA: Posterior interbody fusion

EXAM:
DG C-ARM 61-120 MIN; LUMBAR SPINE - 2-3 VIEW

[Series 1: run · 3 of 3 slices shown]
[im 1/3]
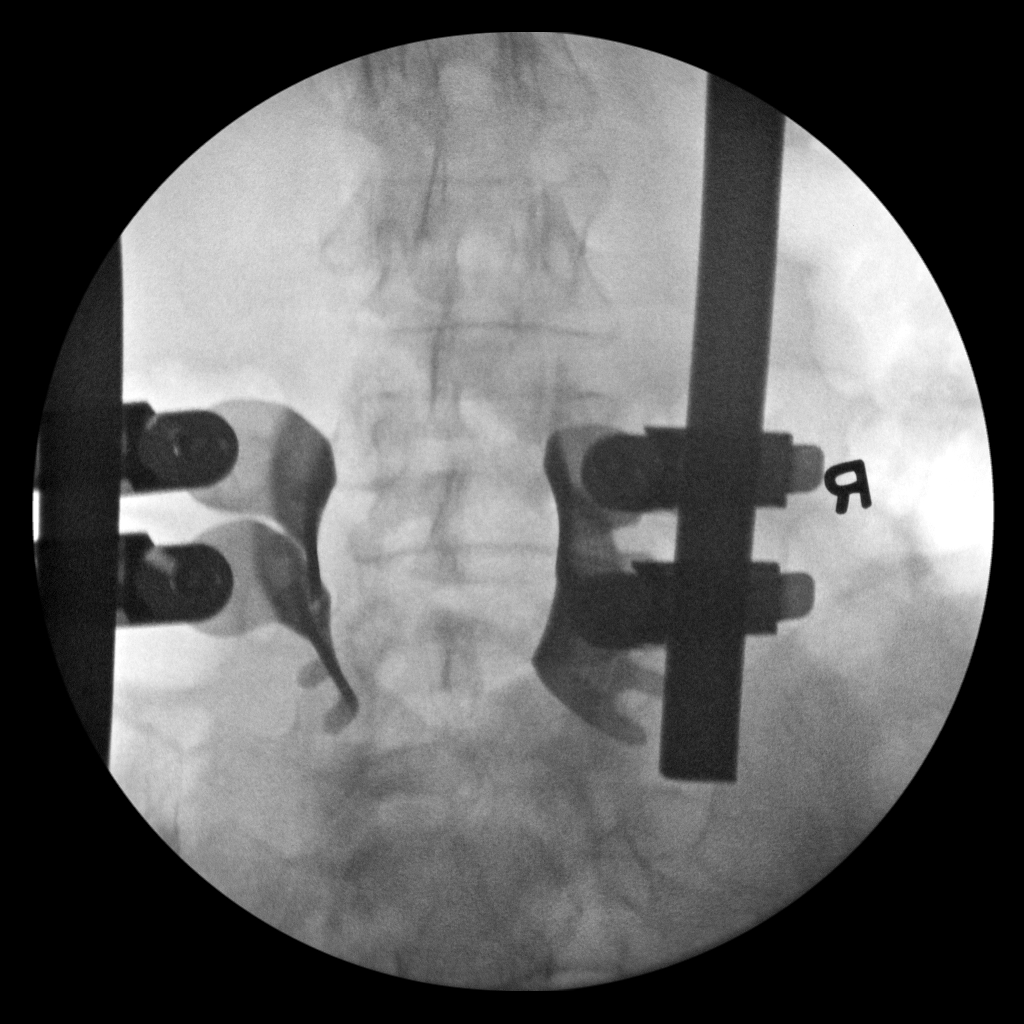
[im 2/3]
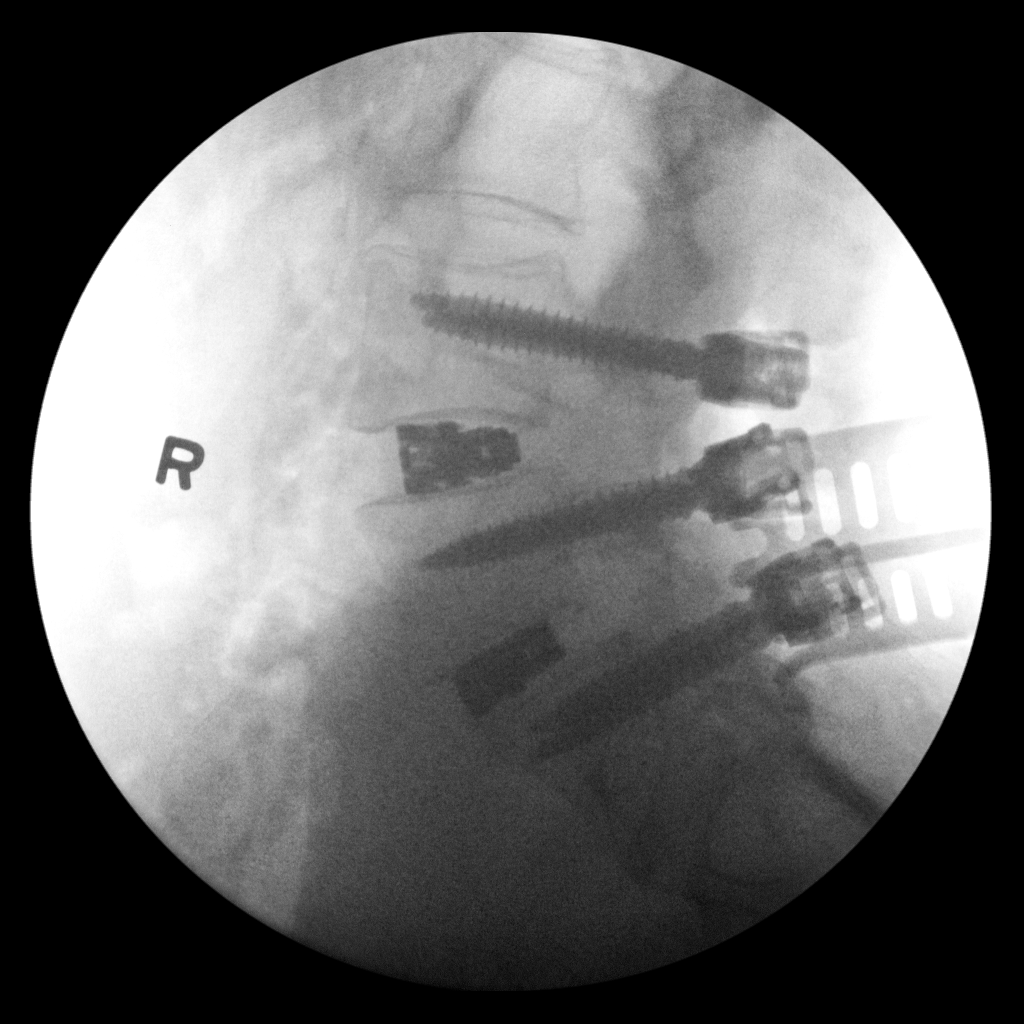
[im 3/3]
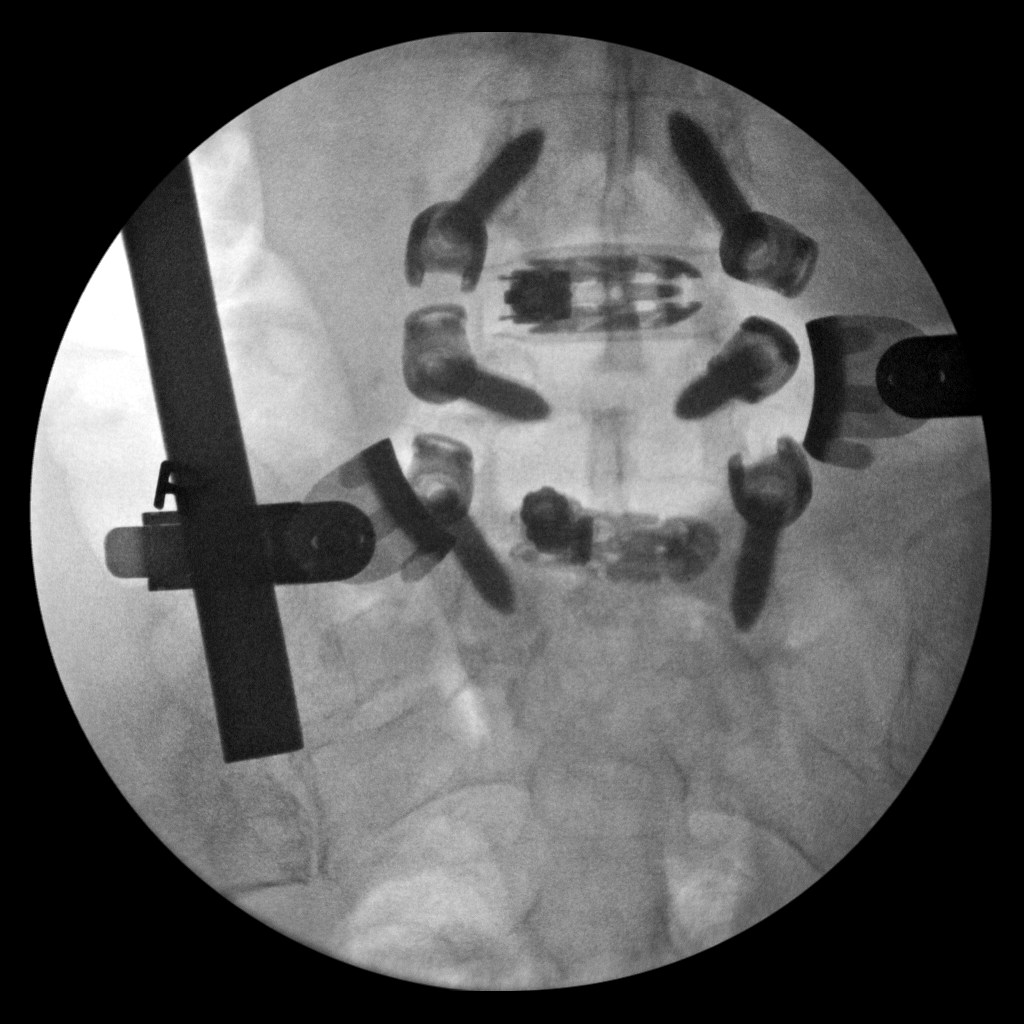

[3 of 3 positions shown; findings below may reference images not displayed]

FINDINGS: Frontal and lateral images show pedicle screws placed at L4, L5, and
S1 bilaterally with the screw tips in the respective vertebral
bodies. There are disc spacers at L4-5 and L5-S1. There is no
evident fracture. There is slight anterolisthesis of L5 on S1. No
other spondylolisthesis evident. Disc spaces appear unremarkable.
IMPRESSION: Pedicle screws at L4, L5, and S1 bilaterally. Disc spacers at L4-5
and L5-S1. Mild spondylolisthesis at L5-S1. No fracture. Disc spaces
appear unremarkable.

## 2019-11-02 IMAGING — CR DG LUMBAR SPINE 1V
1 series · 1 of 1 positions shown · non-contrast
Comparison: CT lumbar spine of 12/14/2016 and MR lumbar spine of
08/17/2016

CLINICAL DATA: For posterior lumbar spine fusion

EXAM:
LUMBAR SPINE - 1 VIEW

[xtable lateral]
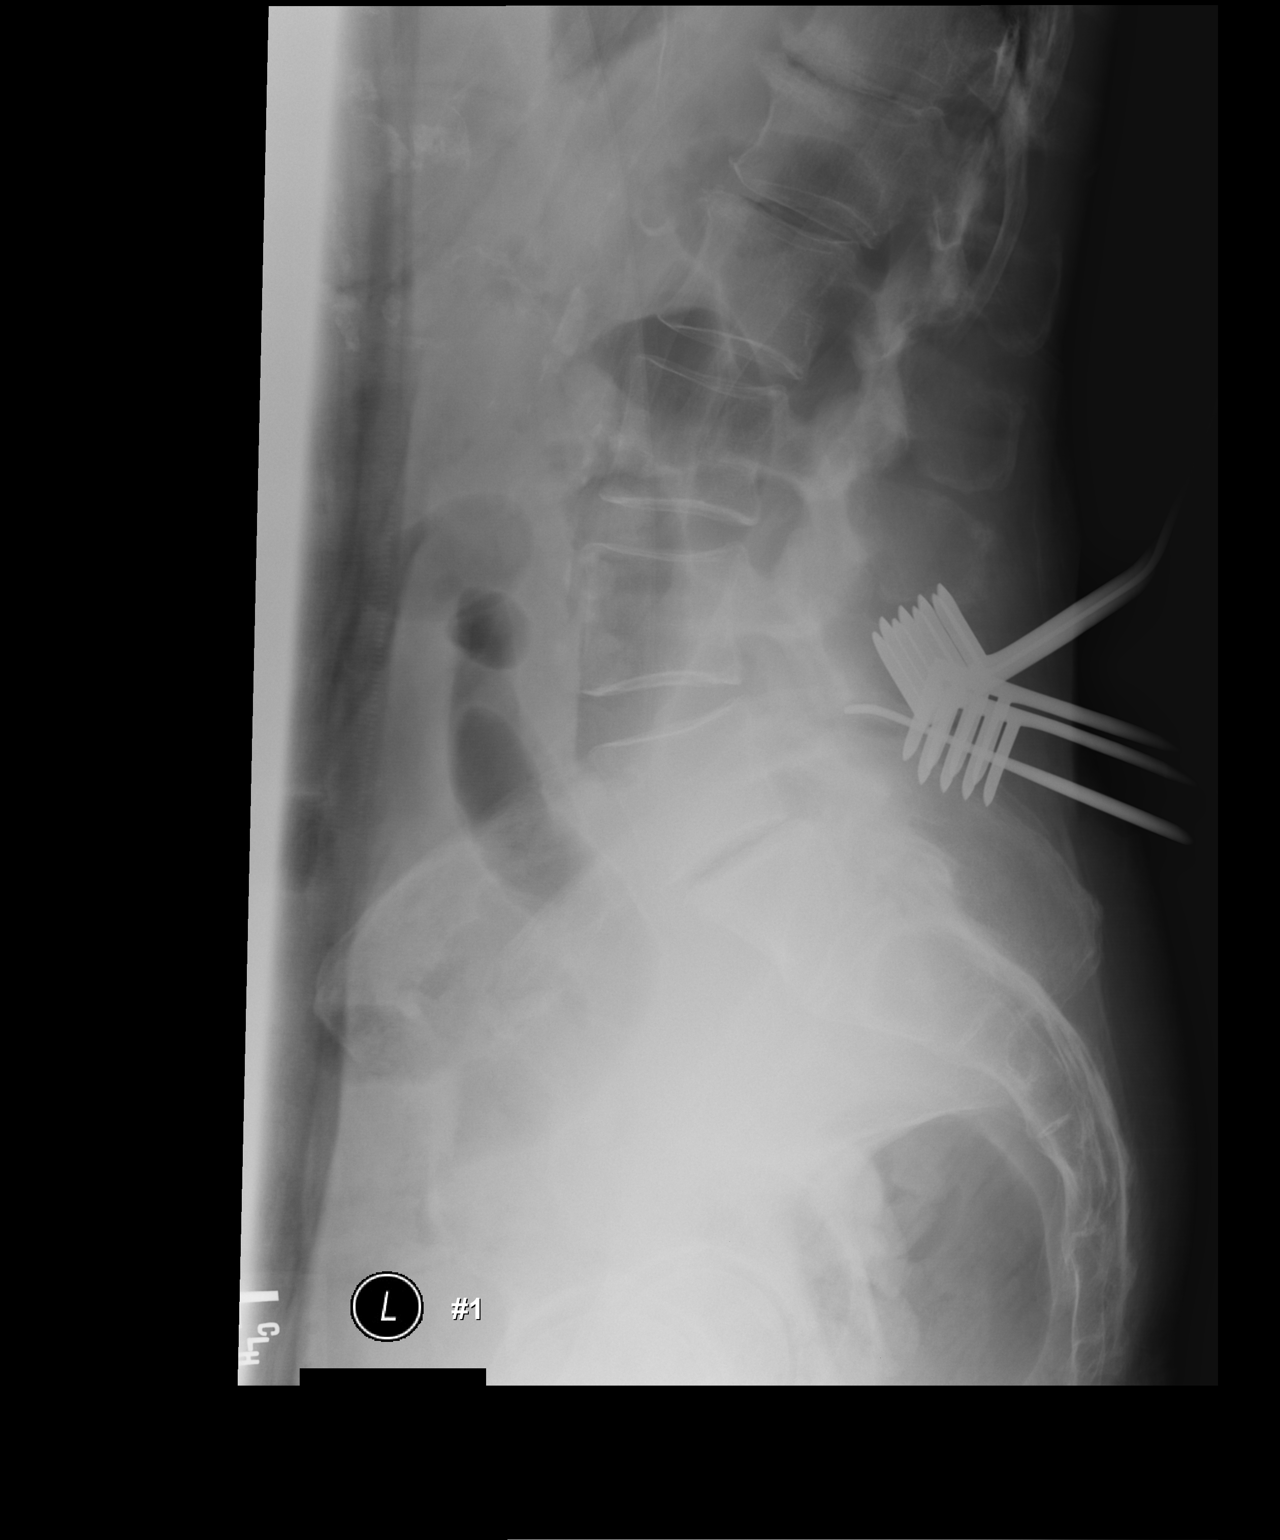

[1 of 1 positions shown; findings below may reference images not displayed]

FINDINGS: And instrument is positioned posteriorly beneath the spinous process
of L4, directed toward the L4-5 interspace for localization.
IMPRESSION: Instrument posteriorly directed toward the L4-5 interspace for
localization.

## 2020-02-21 ENCOUNTER — Other Ambulatory Visit: Payer: Self-pay | Admitting: Internal Medicine

## 2020-02-21 DIAGNOSIS — Z0000 Encounter for general adult medical examination without abnormal findings: Secondary | ICD-10-CM

## 2020-04-04 ENCOUNTER — Ambulatory Visit
Admission: RE | Admit: 2020-04-04 | Discharge: 2020-04-04 | Disposition: A | Discharge status: Home / Self Care | Payer: Medicare Other | Source: Ambulatory Visit | Attending: Internal Medicine | Admitting: Internal Medicine

## 2020-04-04 ENCOUNTER — Other Ambulatory Visit: Payer: Self-pay

## 2020-04-04 DIAGNOSIS — Z0000 Encounter for general adult medical examination without abnormal findings: Secondary | ICD-10-CM

## 2020-12-17 ENCOUNTER — Other Ambulatory Visit: Payer: Self-pay | Admitting: Neurosurgery

## 2020-12-17 DIAGNOSIS — M4722 Other spondylosis with radiculopathy, cervical region: Secondary | ICD-10-CM

## 2020-12-17 DIAGNOSIS — M5136 Other intervertebral disc degeneration, lumbar region: Secondary | ICD-10-CM

## 2021-01-09 ENCOUNTER — Other Ambulatory Visit: Payer: Self-pay

## 2021-01-09 ENCOUNTER — Ambulatory Visit
Admission: RE | Admit: 2021-01-09 | Discharge: 2021-01-09 | Disposition: A | Discharge status: Home / Self Care | Payer: Medicare Other | Source: Ambulatory Visit | Attending: Neurosurgery | Admitting: Neurosurgery

## 2021-01-09 DIAGNOSIS — M4722 Other spondylosis with radiculopathy, cervical region: Secondary | ICD-10-CM

## 2021-01-09 DIAGNOSIS — M4316 Spondylolisthesis, lumbar region: Secondary | ICD-10-CM

## 2021-01-09 MED ORDER — GADOBENATE DIMEGLUMINE 529 MG/ML IV SOLN
17.0000 mL | Freq: Once | INTRAVENOUS | Status: AC | PRN
  Administered 2021-01-09: 17 mL via INTRAVENOUS

## 2021-02-27 ENCOUNTER — Other Ambulatory Visit: Payer: Self-pay | Admitting: Internal Medicine

## 2021-02-27 DIAGNOSIS — Z1231 Encounter for screening mammogram for malignant neoplasm of breast: Secondary | ICD-10-CM

## 2021-04-06 ENCOUNTER — Ambulatory Visit: Payer: Medicare Other

## 2021-04-07 ENCOUNTER — Ambulatory Visit
Admission: RE | Admit: 2021-04-07 | Discharge: 2021-04-07 | Disposition: A | Discharge status: Home / Self Care | Payer: Medicare Other | Source: Ambulatory Visit | Attending: Internal Medicine | Admitting: Internal Medicine

## 2021-04-07 DIAGNOSIS — Z1231 Encounter for screening mammogram for malignant neoplasm of breast: Secondary | ICD-10-CM

## 2021-05-08 ENCOUNTER — Other Ambulatory Visit: Payer: Self-pay | Admitting: Student

## 2021-05-08 DIAGNOSIS — M5416 Radiculopathy, lumbar region: Secondary | ICD-10-CM

## 2021-05-13 ENCOUNTER — Ambulatory Visit
Admission: RE | Admit: 2021-05-13 | Discharge: 2021-05-13 | Disposition: A | Discharge status: Home / Self Care | Payer: Medicare Other | Source: Ambulatory Visit | Attending: Student | Admitting: Student

## 2021-05-13 ENCOUNTER — Other Ambulatory Visit: Payer: Self-pay

## 2021-05-13 DIAGNOSIS — M5416 Radiculopathy, lumbar region: Secondary | ICD-10-CM

## 2021-05-13 MED ORDER — DIAZEPAM 5 MG PO TABS
5.0000 mg | ORAL_TABLET | Freq: Once | ORAL | Status: AC
  Administered 2021-05-13: 5 mg via ORAL

## 2021-05-13 MED ORDER — ONDANSETRON HCL 4 MG/2ML IJ SOLN: 4.0000 mg | Freq: Once | INTRAMUSCULAR | Status: DC | PRN

## 2021-05-13 MED ORDER — IOPAMIDOL (ISOVUE-M 200) INJECTION 41%
18.0000 mL | Freq: Once | INTRAMUSCULAR | Status: AC
  Administered 2021-05-13: 18 mL via INTRATHECAL

## 2021-05-13 MED ORDER — MEPERIDINE HCL 50 MG/ML IJ SOLN: 50.0000 mg | Freq: Once | INTRAMUSCULAR | Status: DC | PRN

## 2021-05-13 NOTE — Discharge Instructions (Signed)

## 2022-03-01 ENCOUNTER — Other Ambulatory Visit: Payer: Self-pay | Admitting: Internal Medicine

## 2022-03-01 DIAGNOSIS — Z1231 Encounter for screening mammogram for malignant neoplasm of breast: Secondary | ICD-10-CM

## 2022-04-21 ENCOUNTER — Ambulatory Visit
Admission: RE | Admit: 2022-04-21 | Discharge: 2022-04-21 | Disposition: A | Discharge status: Home / Self Care | Payer: Medicare Other | Source: Ambulatory Visit | Attending: Internal Medicine | Admitting: Internal Medicine

## 2022-04-21 DIAGNOSIS — Z1231 Encounter for screening mammogram for malignant neoplasm of breast: Secondary | ICD-10-CM

## 2023-03-15 ENCOUNTER — Other Ambulatory Visit: Payer: Self-pay | Admitting: Internal Medicine

## 2023-03-15 DIAGNOSIS — Z1231 Encounter for screening mammogram for malignant neoplasm of breast: Secondary | ICD-10-CM

## 2023-04-25 ENCOUNTER — Ambulatory Visit
Admission: RE | Admit: 2023-04-25 | Discharge: 2023-04-25 | Disposition: A | Discharge status: Home / Self Care | Payer: Medicare Other | Source: Ambulatory Visit | Attending: Internal Medicine | Admitting: Internal Medicine

## 2023-04-25 DIAGNOSIS — Z1231 Encounter for screening mammogram for malignant neoplasm of breast: Secondary | ICD-10-CM

## 2023-05-05 NOTE — Progress Notes (Signed)
 Surgery orders requested via Epic inbox.

## 2023-05-10 NOTE — H&P (Signed)
 Patient's anticipated LOS is less than 2 midnights, meeting these requirements: - Younger than 70 - Lives within 1 hour of care - Has a competent adult at home to recover with post-op recover - NO history of  - Chronic pain requiring opiods  - Diabetes  - Coronary Artery Disease  - Heart failure  - Heart attack  - Stroke  - DVT/VTE  - Cardiac arrhythmia  - Respiratory Failure/COPD  - Renal failure  - Anemia  - Advanced Liver disease     Debbie Taylor is an 89 y.o. female.    Chief Complaint: right knee pain  HPI: Pt is a 89 y.o. female complaining of right knee pain for multiple years. Pain had continually increased since the beginning. X-rays in the clinic show end-stage arthritic changes of the right knee. Pt has tried various conservative treatments which have failed to alleviate their symptoms, including injections and therapy. Various options are discussed with the patient. Risks, benefits and expectations were discussed with the patient. Patient understand the risks, benefits and expectations and wishes to proceed with surgery.   PCP:  Cleatis Polka., MD  D/C Plans: Home  PMH: Past Medical History:  Diagnosis Date   Arthritis    GERD (gastroesophageal reflux disease)    History of hiatal hernia    Hypertension    Hypothyroidism    Lumbar adjacent segment disease with spondylolisthesis    PONV (postoperative nausea and vomiting)    Pulmonary embolism (HCC)    after hysterectomy in 1988   Seasonal allergies    Thyroid disease     PSH: Past Surgical History:  Procedure Laterality Date   ABDOMINAL HYSTERECTOMY     BACK SURGERY     BREAST EXCISIONAL BIOPSY Right 10+ years   benign   BREAST SURGERY Right 1961   BREAST SURGERY Left 1996   tumor   CATARACT EXTRACTION W/ INTRAOCULAR LENS IMPLANT Bilateral    COLONOSCOPY     COLONOSCOPY W/ BIOPSIES AND POLYPECTOMY     GANGLION CYST EXCISION      Social History:  reports that she has never smoked.  She has never used smokeless tobacco. She reports current alcohol use. She reports that she does not use drugs. BMI: Estimated body mass index is 31.07 kg/m as calculated from the following:   Height as of 08/17/18: 5\' 5"  (1.651 m).   Weight as of 08/17/18: 84.7 kg.  No results found for: "ALBUMIN" Diabetes: Patient does not have a diagnosis of diabetes.     Smoking Status:   reports that she has never smoked. She has never used smokeless tobacco.    Allergies:  Allergies  Allergen Reactions   Calcium Channel Blockers     Other reaction(s): amlodipine-swelling in legs   Codeine Hives and Itching   Crestor [Rosuvastatin]     Other reaction(s): myalgias   Statins     Muscle pain   Zetia [Ezetimibe]     Muscle Pain   Celebrex [Celecoxib] Palpitations    Medications: No current facility-administered medications for this encounter.   Current Outpatient Medications  Medication Sig Dispense Refill   Calcium Carb-Cholecalciferol (CALCIUM 600+D) 600-800 MG-UNIT TABS Take 1 tablet by mouth every evening.     DULoxetine (CYMBALTA) 60 MG capsule Take 60 mg by mouth at bedtime.     EPINEPHrine 0.3 mg/0.3 mL IJ SOAJ injection Inject 0.3 mg into the muscle as needed for anaphylaxis.      estradiol (ESTRACE) 0.1 MG/GM vaginal cream  Place 1 Applicatorful vaginally 2 (two) times a week.     irbesartan (AVAPRO) 75 MG tablet Take 75 mg by mouth in the morning.     levothyroxine (SYNTHROID) 88 MCG tablet Take 88 mcg by mouth daily before breakfast.     metoprolol succinate (TOPROL-XL) 25 MG 24 hr tablet Take 25 mg by mouth in the morning.     mirabegron ER (MYRBETRIQ) 25 MG TB24 tablet Take 25 mg by mouth at bedtime.     pantoprazole (PROTONIX) 40 MG tablet Take 40 mg by mouth in the morning.     Probiotic Product (PROBIOTIC DAILY PO) Take 1 capsule by mouth daily with supper.      No results found for this or any previous visit (from the past 48 hours). No results found.  ROS: Pain with  rom of the right lower extremity  Physical Exam: Alert and oriented 89 y.o. female in no acute distress Cranial nerves 2-12 intact Cervical spine: full rom with no tenderness, nv intact distally Chest: active breath sounds bilaterally, no wheeze rhonchi or rales Heart: regular rate and rhythm, no murmur Abd: non tender non distended with active bowel sounds Hip is stable with rom  Right knee painful rom with crepitus Nv intact distally Antalgic gait  Assessment/Plan Assessment: right knee end stage osteoarthritis  Plan:  Patient will undergo a right total knee by Dr. Ranell Patrick at [place] Risks benefits and expectations were discussed with the patient. Patient understand risks, benefits and expectations and wishes to proceed. Preoperative templating of the joint replacement has been completed, documented, and submitted to the Operating Room personnel in order to optimize intra-operative equipment management.   Debbie Overall PA-C, MPAS [facility] is now [employer] [address], [place], [place] [address] Phone: [phone] [email or web address] Facebook  [employer]

## 2023-05-11 NOTE — Progress Notes (Signed)
 COVID Vaccine received:  []  No [x]  Yes Date of any COVID positive Test in last 90 days:  PCP - Youlanda Mighty, MD at [facility]  [phone]   Medical clearance scanned to Media Cardiologist - None  Chest x-ray -  EKG - 08-09-2018  Epic   will repeat  Stress Test -  ECHO -  Cardiac Cath -   PCR screen: [x]  Ordered & Completed []   No Order but Needs PROFEND     []   N/A for this surgery  Surgery Plan:  []  Ambulatory   [x]  Outpatient in bed  []  Admit Anesthesia:    []  General  [x]  Spinal  []   Choice []   MAC  Pacemaker / ICD device [x]  No []  Yes   Spinal Cord Stimulator:[x]  No []  Yes       History of Sleep Apnea? [x]  No []  Yes   CPAP used?- [x]  No []  Yes    Does the patient monitor blood sugar?   [x]  N/A   []  No []  Yes  Patient has: [x]  NO Hx DM   []  Pre-DM   []  DM1  []   DM2  Blood Thinner / Instructions:  none Aspirin Instructions:  none  ERAS Protocol Ordered: []  No  [x]  Yes PRE-SURGERY [x]  ENSURE  []  G2   Patient is to be NPO after: 12:15  Dental hx: []  Dentures:  []  N/A      []  Bridge or Partial:                   []  Loose or Damaged teeth:   Comments: Patient was given the 5 CHG shower / bath instructions for TKA surgery along with 2 bottles of the CHG soap. Patient will start this on: 05-16-23 All questions were asked and answered, Patient voiced understanding of this process.   Activity level: Patient is able / unable to climb a flight of stairs without difficulty; []  No CP  []  No SOB, but would have ___   Patient can / can not perform ADLs without assistance.   Anesthesia review: HTN, PONV, GERD, Hx PE after TAH (1988), HOH-   ,  s/p lumbar fusions x 2 (L3-4, L4-5, L5-S1)  Patient denies shortness of breath, fever, cough and chest pain at PAT appointment.  Patient verbalized understanding and agreement to the Pre-Surgical Instructions that were given to them at this PAT appointment. Patient was also educated of the need to review these PAT instructions again  prior to her surgery.I reviewed the appropriate phone numbers to call if they have any and questions or concerns.

## 2023-05-11 NOTE — Patient Instructions (Signed)
 SURGICAL WAITING ROOM VISITATION Patients having surgery or a procedure may have no more than 2 support people in the waiting area - these visitors may rotate in the visitor waiting room.   Due to an increase in RSV and influenza rates and associated hospitalizations, children ages 55 and under may not visit patients in [facility] hospitals. If the patient needs to stay at the hospital during part of their recovery, the visitor guidelines for inpatient rooms apply.  PRE-OP VISITATION  Pre-op nurse will coordinate an appropriate time for 1 support person to accompany the patient in pre-op.  This support person may not rotate.  This visitor will be contacted when the time is appropriate for the visitor to come back in the pre-op area.  Please refer to the [facility] website for the visitor guidelines for Inpatients (after your surgery is over and you are in a regular room).  You are not required to quarantine at this time prior to your surgery. However, you must do this: Hand Hygiene often Do NOT share personal items Notify your provider if you are in close contact with someone who has COVID or you develop fever 100.4 or greater, new onset of sneezing, cough, sore throat, shortness of breath or body aches.  If you test positive for Covid or have been in contact with anyone that has tested positive in the last 10 days please notify you surgeon.    Your procedure is scheduled on:  FRIDAY  May 20, 2023  Report to [facility] Main Entrance: [address] entrance where the [employer] is available.   Report to admitting at: 12:45    AM  Call this number if you have any questions or problems the morning of surgery [phone]  Do not eat food after Midnight the night prior to your surgery/procedure.  After Midnight you may have the following liquids until  12:15 PM DAY OF SURGERY  Clear Liquid Diet Water Black Coffee (sugar ok, NO MILK/CREAM OR CREAMERS)  Tea (sugar ok, NO  MILK/CREAM OR CREAMERS) regular and decaf                             Plain Jell-O  with no fruit (NO RED)                                           Fruit ices (not with fruit pulp, NO RED)                                     Popsicles (NO RED)                                                                  Juice: NO CITRUS JUICES: only apple, WHITE grape, WHITE cranberry Sports drinks like Gatorade or Powerade (NO RED)                   The day of surgery:  Drink ONE (1) Pre-Surgery Clear Ensure at  12:15 PM the morning of surgery. Drink  in one sitting. Do not sip.  This drink was given to you during your hospital pre-op appointment visit. Nothing else to drink after completing the Pre-Surgery Clear Ensure : No candy, chewing gum or throat lozenges.    FOLLOW ANY ADDITIONAL PRE OP INSTRUCTIONS YOU RECEIVED FROM YOUR SURGEON'S OFFICE!!!   Oral Hygiene is also important to reduce your risk of infection.        Remember - BRUSH YOUR TEETH THE MORNING OF SURGERY WITH YOUR REGULAR TOOTHPASTE  Do NOT smoke after Midnight the night before surgery.  STOP TAKING all Vitamins, Herbs and supplements 1 week before your surgery. Stop using Estrace cream 1 week before surgery.    Take ONLY these medicines the morning of surgery with A SIP OF WATER: pantoprazole, levothyroxine, Metoprolol.                    You may not have any metal on your body including hair pins, jewelry, and body piercing  Do not wear make-up, lotions, powders, perfumes  or deodorant  Do not wear nail polish including gel and S&S, artificial / acrylic nails, or any other type of covering on natural nails including finger and toenails. If you have artificial nails, gel coating, etc., that needs to be removed by a nail salon, Please have this removed prior to surgery. Not doing so may mean that your surgery could be cancelled or delayed if the Surgeon or anesthesia staff feels like they are unable to monitor you safely.   Do  not shave 48 hours prior to surgery to avoid nicks in your skin which may contribute to postoperative infections.   Contacts, Hearing Aids, dentures or bridgework may not be worn into surgery. DENTURES WILL BE REMOVED PRIOR TO SURGERY PLEASE DO NOT APPLY "Poly grip" OR ADHESIVES!!!  You may bring a small overnight bag with you on the day of surgery, only pack items that are not valuable. [facility] IS NOT RESPONSIBLE   FOR VALUABLES THAT ARE LOST OR STOLEN.   Do not bring your home medications to the hospital. The Pharmacy will dispense medications listed on your medication list to you during your admission in the Hospital.  Special Instructions: Bring a copy of your healthcare power of attorney and living will documents the day of surgery, if you wish to have them scanned into your [facility] Medical Records- EPIC  Please read over the following fact sheets you were given: IF YOU HAVE QUESTIONS ABOUT YOUR PRE-OP INSTRUCTIONS, PLEASE CALL [phone].     Pre-operative 5 CHG Bath Instructions   You can play a key role in reducing the risk of infection after surgery. Your skin needs to be as free of germs as possible. You can reduce the number of germs on your skin by washing with CHG (chlorhexidine gluconate) soap before surgery. CHG is an antiseptic soap that kills germs and continues to kill germs even after washing.   DO NOT use if you have an allergy to chlorhexidine/CHG or antibacterial soaps. If your skin becomes reddened or irritated, stop using the CHG and notify one of our RNs at [phone]  Please shower with the CHG soap starting 4 days before surgery using the following schedule: START SHOWERS ON   Monday  May 16, 2023  Please keep in mind the following:  DO NOT shave, including legs and underarms,  starting the day of your first shower.   You may shave your face at any point before/day of surgery.   Place clean sheets on your bed the day you start using CHG soap. Use a clean washcloth (not used since being washed) for each shower. DO NOT sleep with pets once you start using the CHG.   CHG Shower Instructions:  If you choose to wash your hair and private area, wash first with your normal shampoo/soap.  After you use shampoo/soap, rinse your hair and body thoroughly to remove shampoo/soap residue.  Turn the water OFF and apply about 3 tablespoons (45 ml) of CHG soap to a CLEAN washcloth.  Apply CHG soap ONLY FROM YOUR NECK DOWN TO YOUR TOES (washing for 3-5 minutes)  DO NOT use CHG soap on face, private areas, open wounds, or sores.  Pay special attention to the area where your surgery is being performed.  If you are having back surgery, having someone wash your back for you may be helpful.  Wait 2 minutes after CHG soap is applied, then you may rinse off the CHG soap.  Pat dry with a clean towel  Put on clean clothes/pajamas   If you choose to wear lotion, please use ONLY the CHG-compatible lotions on the back of this paper.     Additional instructions for the day of surgery: DO NOT APPLY any lotions, deodorants, cologne, or perfumes.   Put on clean/comfortable clothes.  Brush your teeth.  Ask your nurse before applying any prescription medications to the skin.      CHG Compatible Lotions   Aveeno Moisturizing lotion  Cetaphil Moisturizing Cream  Cetaphil Moisturizing Lotion  Clairol Herbal Essence Moisturizing Lotion, Dry Skin  Clairol Herbal Essence Moisturizing Lotion, Extra Dry Skin  Clairol Herbal Essence Moisturizing Lotion, Normal Skin  Curel Age Defying Therapeutic Moisturizing Lotion with Alpha Hydroxy  Curel Extreme Care Body Lotion  Curel Soothing Hands Moisturizing Hand Lotion  Curel Therapeutic Moisturizing Cream, Fragrance-Free  Curel Therapeutic  Moisturizing Lotion, Fragrance-Free  Curel Therapeutic Moisturizing Lotion, Original Formula  Eucerin Daily Replenishing Lotion  Eucerin Dry Skin Therapy Plus Alpha Hydroxy Crme  Eucerin Dry Skin Therapy Plus Alpha Hydroxy Lotion  Eucerin Original Crme  Eucerin Original Lotion  Eucerin Plus Crme Eucerin Plus Lotion  Eucerin TriLipid Replenishing Lotion  Keri Anti-Bacterial Hand Lotion  Keri Deep Conditioning Original Lotion Dry Skin Formula Softly Scented  Keri Deep Conditioning Original Lotion, Fragrance Free Sensitive Skin Formula  Keri Lotion Fast Absorbing Fragrance Free Sensitive Skin Formula  Keri Lotion Fast Absorbing Softly Scented Dry Skin Formula  Keri Original Lotion  Keri Skin Renewal Lotion Keri Silky Smooth Lotion  Keri Silky Smooth Sensitive Skin Lotion  Nivea Body Creamy Conditioning Oil  Nivea Body Extra Enriched Lotion  Nivea Body Original Lotion  Nivea Body Sheer Moisturizing Lotion Nivea Crme  Nivea Skin Firming Lotion  NutraDerm 30 Skin Lotion  NutraDerm Skin Lotion  NutraDerm Therapeutic Skin Cream  NutraDerm Therapeutic Skin Lotion  ProShield Protective Hand Cream  Provon moisturizing lotion   FAILURE TO FOLLOW THESE INSTRUCTIONS MAY RESULT IN THE CANCELLATION OF YOUR SURGERY  PATIENT SIGNATURE_________________________________  NURSE SIGNATURE__________________________________  ________________________________________________________________________       Debbie Taylor    An incentive spirometer is a tool that can help keep your lungs clear and active. This tool measures how well you are filling your lungs with each breath. Taking  long deep breaths may help reverse or decrease the chance of developing breathing (pulmonary) problems (especially infection) following: A long period of time when you are unable to move or be active. BEFORE THE PROCEDURE  If the spirometer includes an indicator to show your best effort, your nurse or  respiratory therapist will set it to a desired goal. If possible, sit up straight or lean slightly forward. Try not to slouch. Hold the incentive spirometer in an upright position. INSTRUCTIONS FOR USE  Sit on the edge of your bed if possible, or sit up as far as you can in bed or on a chair. Hold the incentive spirometer in an upright position. Breathe out normally. Place the mouthpiece in your mouth and seal your lips tightly around it. Breathe in slowly and as deeply as possible, raising the piston or the ball toward the top of the column. Hold your breath for 3-5 seconds or for as long as possible. Allow the piston or ball to fall to the bottom of the column. Remove the mouthpiece from your mouth and breathe out normally. Rest for a few seconds and repeat Steps 1 through 7 at least 10 times every 1-2 hours when you are awake. Take your time and take a few normal breaths between deep breaths. The spirometer may include an indicator to show your best effort. Use the indicator as a goal to work toward during each repetition. After each set of 10 deep breaths, practice coughing to be sure your lungs are clear. If you have an incision (the cut made at the time of surgery), support your incision when coughing by placing a pillow or rolled up towels firmly against it. Once you are able to get out of bed, walk around indoors and cough well. You may stop using the incentive spirometer when instructed by your caregiver.  RISKS AND COMPLICATIONS Take your time so you do not get dizzy or light-headed. If you are in pain, you may need to take or ask for pain medication before doing incentive spirometry. It is harder to take a deep breath if you are having pain. AFTER USE Rest and breathe slowly and easily. It can be helpful to keep track of a log of your progress. Your caregiver can provide you with a simple table to help with this. If you are using the spirometer at home, follow these  instructions: SEEK MEDICAL CARE IF:  You are having difficultly using the spirometer. You have trouble using the spirometer as often as instructed. Your pain medication is not giving enough relief while using the spirometer. You develop fever of 100.5 F (38.1 C) or higher.                                                                                                    SEEK IMMEDIATE MEDICAL CARE IF:  You cough up bloody sputum that had not been present before. You develop fever of 102 F (38.9 C) or greater. You develop worsening pain at or near the incision site. MAKE SURE YOU:  Understand these instructions. Will watch your condition.  Will get help right away if you are not doing well or get worse. Document Released: 06/21/2006 Document Revised: 05/03/2011 Document Reviewed: 08/22/2006 [facility] Patient Information 2014 [place], [place].        If you would like to see a video about joint replacement:   [email or web address]

## 2023-05-12 ENCOUNTER — Other Ambulatory Visit: Payer: Self-pay

## 2023-05-12 ENCOUNTER — Encounter (HOSPITAL_COMMUNITY)
Admission: RE | Admit: 2023-05-12 | Discharge: 2023-05-12 | Disposition: A | Discharge status: Home / Self Care | Source: Ambulatory Visit | Attending: Orthopedic Surgery | Admitting: Orthopedic Surgery

## 2023-05-12 ENCOUNTER — Encounter (HOSPITAL_COMMUNITY): Payer: Self-pay

## 2023-05-12 VITALS — BP 130/54 | HR 72 | Temp 98.3°F | Resp 16 | Ht 65.0 in | Wt 179.0 lb

## 2023-05-12 DIAGNOSIS — Z01818 Encounter for other preprocedural examination: Secondary | ICD-10-CM | POA: Diagnosis present

## 2023-05-12 DIAGNOSIS — I498 Other specified cardiac arrhythmias: Secondary | ICD-10-CM | POA: Insufficient documentation

## 2023-05-12 DIAGNOSIS — I10 Essential (primary) hypertension: Secondary | ICD-10-CM | POA: Diagnosis not present

## 2023-05-12 HISTORY — DX: Depression, unspecified: F32.A

## 2023-05-12 HISTORY — DX: Anxiety disorder, unspecified: F41.9

## 2023-05-12 HISTORY — DX: Malignant (primary) neoplasm, unspecified: C80.1

## 2023-05-12 LAB — CBC
HCT: 37.1 % (ref 36.0–46.0)
Hemoglobin: 12.2 g/dL (ref 12.0–15.0)
MCH: 31.5 pg (ref 26.0–34.0)
MCHC: 32.9 g/dL (ref 30.0–36.0)
MCV: 95.9 fL (ref 80.0–100.0)
Platelets: 296 10*3/uL (ref 150–400)
RBC: 3.87 MIL/uL (ref 3.87–5.11)
RDW: 13.8 % (ref 11.5–15.5)
WBC: 8.8 10*3/uL (ref 4.0–10.5)
nRBC: 0 % (ref 0.0–0.2)

## 2023-05-12 LAB — BASIC METABOLIC PANEL
Anion gap: 9 (ref 5–15)
BUN: 30 mg/dL — ABNORMAL HIGH (ref 8–23)
CO2: 25 mmol/L (ref 22–32)
Calcium: 9.5 mg/dL (ref 8.9–10.3)
Chloride: 105 mmol/L (ref 98–111)
Creatinine, Ser: 1.26 mg/dL — ABNORMAL HIGH (ref 0.44–1.00)
GFR, Estimated: 41 mL/min — ABNORMAL LOW (ref >60)
Glucose, Bld: 105 mg/dL — ABNORMAL HIGH (ref 70–99)
Potassium: 4.8 mmol/L (ref 3.5–5.1)
Sodium: 139 mmol/L (ref 135–145)

## 2023-05-12 LAB — SURGICAL PCR SCREEN
MRSA, PCR: NEGATIVE
Staphylococcus aureus: NEGATIVE

## 2023-05-20 ENCOUNTER — Other Ambulatory Visit: Payer: Self-pay

## 2023-05-20 ENCOUNTER — Encounter (HOSPITAL_COMMUNITY)
Admission: RE | Disposition: A | Discharge status: Home / Self Care | Payer: Self-pay | Source: Ambulatory Visit | Attending: Orthopedic Surgery

## 2023-05-20 ENCOUNTER — Ambulatory Visit (HOSPITAL_COMMUNITY): Admitting: Anesthesiology

## 2023-05-20 ENCOUNTER — Observation Stay (HOSPITAL_COMMUNITY)
Admission: RE | Admit: 2023-05-20 | Discharge: 2023-05-22 | Disposition: A | Discharge status: Home / Self Care | Source: Ambulatory Visit | Attending: Orthopedic Surgery | Admitting: Orthopedic Surgery

## 2023-05-20 ENCOUNTER — Encounter (HOSPITAL_COMMUNITY): Payer: Self-pay | Admitting: Orthopedic Surgery

## 2023-05-20 DIAGNOSIS — M1711 Unilateral primary osteoarthritis, right knee: Principal | ICD-10-CM | POA: Insufficient documentation

## 2023-05-20 DIAGNOSIS — M4316 Spondylolisthesis, lumbar region: Secondary | ICD-10-CM | POA: Insufficient documentation

## 2023-05-20 DIAGNOSIS — Z79899 Other long term (current) drug therapy: Secondary | ICD-10-CM | POA: Diagnosis not present

## 2023-05-20 DIAGNOSIS — I10 Essential (primary) hypertension: Secondary | ICD-10-CM | POA: Insufficient documentation

## 2023-05-20 DIAGNOSIS — Z96651 Presence of right artificial knee joint: Principal | ICD-10-CM

## 2023-05-20 DIAGNOSIS — F418 Other specified anxiety disorders: Secondary | ICD-10-CM | POA: Diagnosis not present

## 2023-05-20 DIAGNOSIS — Z86711 Personal history of pulmonary embolism: Secondary | ICD-10-CM | POA: Diagnosis not present

## 2023-05-20 DIAGNOSIS — E039 Hypothyroidism, unspecified: Secondary | ICD-10-CM | POA: Insufficient documentation

## 2023-05-20 SURGERY — ARTHROPLASTY, KNEE, TOTAL
Anesthesia: General | Site: Knee | Laterality: Right

## 2023-05-20 MED ORDER — LEVOTHYROXINE SODIUM 88 MCG PO TABS
88.0000 ug | ORAL_TABLET | Freq: Every day | ORAL | Status: DC
Start: 2023-05-21 — End: 2023-05-22
  Administered 2023-05-21 – 2023-05-22 (×2): 88 ug via ORAL
  Filled 2023-05-20 (×2): qty 1

## 2023-05-20 MED ORDER — FENTANYL CITRATE (PF) 100 MCG/2ML IJ SOLN
INTRAMUSCULAR | Status: AC
Start: 2023-05-20 — End: ?
  Filled 2023-05-20: qty 2

## 2023-05-20 MED ORDER — ONDANSETRON HCL 4 MG/2ML IJ SOLN
4.0000 mg | Freq: Four times a day (QID) | INTRAMUSCULAR | Status: DC | PRN
  Administered 2023-05-20: 4 mg via INTRAVENOUS
  Filled 2023-05-20: qty 2

## 2023-05-20 MED ORDER — METHOCARBAMOL 500 MG PO TABS: 500.0000 mg | ORAL_TABLET | Freq: Three times a day (TID) | ORAL | 1 refills | Status: AC | PRN

## 2023-05-20 MED ORDER — TRANEXAMIC ACID-NACL 1000-0.7 MG/100ML-% IV SOLN
1000.0000 mg | INTRAVENOUS | Status: DC
  Filled 2023-05-20: qty 100

## 2023-05-20 MED ORDER — FENTANYL CITRATE (PF) 100 MCG/2ML IJ SOLN
INTRAMUSCULAR | Status: DC | PRN
  Administered 2023-05-20 (×2): 50 ug via INTRAVENOUS
  Administered 2023-05-20 (×2): 25 ug via INTRAVENOUS
  Administered 2023-05-20: 50 ug via INTRAVENOUS

## 2023-05-20 MED ORDER — ONDANSETRON HCL 4 MG/2ML IJ SOLN: 4.0000 mg | Freq: Once | INTRAMUSCULAR | Status: DC | PRN

## 2023-05-20 MED ORDER — ONDANSETRON HCL 4 MG PO TABS: 4.0000 mg | ORAL_TABLET | Freq: Three times a day (TID) | ORAL | 1 refills | Status: AC | PRN

## 2023-05-20 MED ORDER — SODIUM CHLORIDE (PF) 0.9 % IJ SOLN
INTRAMUSCULAR | Status: AC
  Filled 2023-05-20: qty 30

## 2023-05-20 MED ORDER — PHENOL 1.4 % MT LIQD: 1.0000 | OROMUCOSAL | Status: DC | PRN

## 2023-05-20 MED ORDER — MIRABEGRON ER 25 MG PO TB24
25.0000 mg | ORAL_TABLET | Freq: Every day | ORAL | Status: DC
  Administered 2023-05-20 – 2023-05-21 (×2): 25 mg via ORAL
  Filled 2023-05-20 (×3): qty 1

## 2023-05-20 MED ORDER — ORAL CARE MOUTH RINSE: 15.0000 mL | Freq: Once | OROMUCOSAL | Status: DC

## 2023-05-20 MED ORDER — PROPOFOL 1000 MG/100ML IV EMUL
INTRAVENOUS | Status: AC
  Filled 2023-05-20: qty 100

## 2023-05-20 MED ORDER — EPINEPHRINE 0.3 MG/0.3ML IJ SOAJ: 0.3000 mg | INTRAMUSCULAR | Status: DC | PRN

## 2023-05-20 MED ORDER — CEFAZOLIN SODIUM-DEXTROSE 2-4 GM/100ML-% IV SOLN
2.0000 g | Freq: Four times a day (QID) | INTRAVENOUS | Status: AC
  Administered 2023-05-20 (×2): 2 g via INTRAVENOUS
  Filled 2023-05-20 (×2): qty 100

## 2023-05-20 MED ORDER — SODIUM CHLORIDE 0.9% FLUSH: 3.0000 mL | INTRAVENOUS | Status: DC | PRN

## 2023-05-20 MED ORDER — PANTOPRAZOLE SODIUM 40 MG PO TBEC
40.0000 mg | DELAYED_RELEASE_TABLET | Freq: Every day | ORAL | Status: DC
  Administered 2023-05-21 – 2023-05-22 (×2): 40 mg via ORAL
  Filled 2023-05-20 (×2): qty 1

## 2023-05-20 MED ORDER — HYDROMORPHONE HCL 1 MG/ML IJ SOLN: 0.5000 mg | INTRAMUSCULAR | Status: DC | PRN

## 2023-05-20 MED ORDER — ESTRADIOL 0.1 MG/GM VA CREA
1.0000 | TOPICAL_CREAM | VAGINAL | Status: DC
Start: 2023-05-23 — End: 2023-05-20

## 2023-05-20 MED ORDER — OXYCODONE-ACETAMINOPHEN 5-325 MG PO TABS
1.0000 | ORAL_TABLET | Freq: Four times a day (QID) | ORAL | 0 refills | Status: AC | PRN
Start: 2023-05-20 — End: 2024-05-19

## 2023-05-20 MED ORDER — BUPIVACAINE-EPINEPHRINE 0.25% -1:200000 IJ SOLN
INTRAMUSCULAR | Status: DC | PRN
  Administered 2023-05-20: 30 mL

## 2023-05-20 MED ORDER — BUPIVACAINE LIPOSOME 1.3 % IJ SUSP
INTRAMUSCULAR | Status: DC | PRN
  Administered 2023-05-20: 20 mL

## 2023-05-20 MED ORDER — METOCLOPRAMIDE HCL 5 MG/ML IJ SOLN: 5.0000 mg | Freq: Three times a day (TID) | INTRAMUSCULAR | Status: DC | PRN

## 2023-05-20 MED ORDER — SODIUM CHLORIDE (PF) 0.9 % IJ SOLN
INTRAMUSCULAR | Status: DC | PRN
  Administered 2023-05-20: 30 mL via INTRAVENOUS

## 2023-05-20 MED ORDER — PHENYLEPHRINE 80 MCG/ML (10ML) SYRINGE FOR IV PUSH (FOR BLOOD PRESSURE SUPPORT)
PREFILLED_SYRINGE | INTRAVENOUS | Status: DC | PRN
Start: 2023-05-20 — End: 2023-05-20
  Administered 2023-05-20: 40 ug via INTRAVENOUS

## 2023-05-20 MED ORDER — DOCUSATE SODIUM 100 MG PO CAPS
100.0000 mg | ORAL_CAPSULE | Freq: Two times a day (BID) | ORAL | Status: DC
  Administered 2023-05-20 – 2023-05-22 (×4): 100 mg via ORAL
  Filled 2023-05-20 (×4): qty 1

## 2023-05-20 MED ORDER — METOPROLOL SUCCINATE ER 25 MG PO TB24
25.0000 mg | ORAL_TABLET | Freq: Every day | ORAL | Status: DC
  Administered 2023-05-21 – 2023-05-22 (×2): 25 mg via ORAL
  Filled 2023-05-20 (×2): qty 1

## 2023-05-20 MED ORDER — OXYCODONE HCL 5 MG PO TABS
5.0000 mg | ORAL_TABLET | ORAL | Status: DC | PRN
Start: 2023-05-20 — End: 2023-05-22
  Administered 2023-05-20 – 2023-05-22 (×5): 5 mg via ORAL
  Filled 2023-05-20 (×5): qty 1

## 2023-05-20 MED ORDER — ACETAMINOPHEN 325 MG PO TABS: 325.0000 mg | ORAL_TABLET | Freq: Four times a day (QID) | ORAL | Status: DC | PRN

## 2023-05-20 MED ORDER — ONDANSETRON HCL 4 MG/2ML IJ SOLN
INTRAMUSCULAR | Status: DC | PRN
  Administered 2023-05-20 (×2): 4 mg via INTRAVENOUS

## 2023-05-20 MED ORDER — SODIUM CHLORIDE 0.9 % IR SOLN
Status: DC | PRN
  Administered 2023-05-20: 1000 mL

## 2023-05-20 MED ORDER — LIDOCAINE HCL (PF) 2 % IJ SOLN
INTRAMUSCULAR | Status: AC
  Filled 2023-05-20: qty 5

## 2023-05-20 MED ORDER — ASPIRIN 81 MG PO CHEW
81.0000 mg | CHEWABLE_TABLET | Freq: Two times a day (BID) | ORAL | 0 refills | Status: AC
Start: 2023-05-20 — End: 2023-06-19

## 2023-05-20 MED ORDER — DEXMEDETOMIDINE HCL IN NACL 80 MCG/20ML IV SOLN
INTRAVENOUS | Status: AC
Start: 2023-05-20 — End: ?
  Filled 2023-05-20: qty 20

## 2023-05-20 MED ORDER — ONDANSETRON HCL 4 MG PO TABS: 4.0000 mg | ORAL_TABLET | Freq: Four times a day (QID) | ORAL | Status: DC | PRN

## 2023-05-20 MED ORDER — DEXAMETHASONE SODIUM PHOSPHATE 10 MG/ML IJ SOLN
INTRAMUSCULAR | Status: DC | PRN
  Administered 2023-05-20: 8 mg via INTRAVENOUS

## 2023-05-20 MED ORDER — TRANEXAMIC ACID-NACL 1000-0.7 MG/100ML-% IV SOLN
INTRAVENOUS | Status: DC | PRN
Start: 2023-05-20 — End: 2023-05-20
  Administered 2023-05-20: 1000 mg via INTRAVENOUS

## 2023-05-20 MED ORDER — MENTHOL 3 MG MT LOZG: 1.0000 | LOZENGE | OROMUCOSAL | Status: DC | PRN

## 2023-05-20 MED ORDER — FENTANYL CITRATE PF 50 MCG/ML IJ SOSY
25.0000 ug | PREFILLED_SYRINGE | INTRAMUSCULAR | Status: DC | PRN
  Administered 2023-05-20 (×2): 25 ug via INTRAVENOUS

## 2023-05-20 MED ORDER — FENTANYL CITRATE (PF) 100 MCG/2ML IJ SOLN
INTRAMUSCULAR | Status: AC
  Filled 2023-05-20: qty 2

## 2023-05-20 MED ORDER — BISACODYL 10 MG RE SUPP: 10.0000 mg | Freq: Every day | RECTAL | Status: DC | PRN

## 2023-05-20 MED ORDER — PROPOFOL 10 MG/ML IV BOLUS
INTRAVENOUS | Status: DC | PRN
  Administered 2023-05-20: 80 mg via INTRAVENOUS

## 2023-05-20 MED ORDER — BUPIVACAINE LIPOSOME 1.3 % IJ SUSP
INTRAMUSCULAR | Status: AC
  Filled 2023-05-20: qty 20

## 2023-05-20 MED ORDER — POLYETHYLENE GLYCOL 3350 17 G PO PACK: 17.0000 g | PACK | Freq: Every day | ORAL | Status: DC | PRN

## 2023-05-20 MED ORDER — ALUM & MAG HYDROXIDE-SIMETH 200-200-20 MG/5ML PO SUSP
30.0000 mL | Freq: Four times a day (QID) | ORAL | Status: DC | PRN
  Administered 2023-05-20: 30 mL via ORAL
  Filled 2023-05-20 (×2): qty 30

## 2023-05-20 MED ORDER — IRBESARTAN 75 MG PO TABS
75.0000 mg | ORAL_TABLET | Freq: Every day | ORAL | Status: DC
  Administered 2023-05-21 – 2023-05-22 (×2): 75 mg via ORAL
  Filled 2023-05-20 (×2): qty 1

## 2023-05-20 MED ORDER — TRANEXAMIC ACID-NACL 1000-0.7 MG/100ML-% IV SOLN
1000.0000 mg | Freq: Once | INTRAVENOUS | Status: AC
  Administered 2023-05-20: 1000 mg via INTRAVENOUS
  Filled 2023-05-20: qty 100

## 2023-05-20 MED ORDER — PHENYLEPHRINE 80 MCG/ML (10ML) SYRINGE FOR IV PUSH (FOR BLOOD PRESSURE SUPPORT)
PREFILLED_SYRINGE | INTRAVENOUS | Status: AC
  Filled 2023-05-20: qty 10

## 2023-05-20 MED ORDER — METHOCARBAMOL 500 MG PO TABS: 500.0000 mg | ORAL_TABLET | Freq: Four times a day (QID) | ORAL | Status: DC | PRN

## 2023-05-20 MED ORDER — FENTANYL CITRATE PF 50 MCG/ML IJ SOSY
PREFILLED_SYRINGE | INTRAMUSCULAR | Status: AC
  Administered 2023-05-20: 50 ug via INTRAVENOUS
  Filled 2023-05-20: qty 2

## 2023-05-20 MED ORDER — ACETAMINOPHEN 500 MG PO TABS
1000.0000 mg | ORAL_TABLET | Freq: Once | ORAL | Status: AC
  Administered 2023-05-20: 1000 mg via ORAL
  Filled 2023-05-20: qty 2

## 2023-05-20 MED ORDER — OYSTER SHELL CALCIUM/D3 500-5 MG-MCG PO TABS
1.0000 | ORAL_TABLET | Freq: Every day | ORAL | Status: DC
  Administered 2023-05-21 – 2023-05-22 (×2): 1 via ORAL
  Filled 2023-05-20 (×2): qty 1

## 2023-05-20 MED ORDER — BUPIVACAINE-EPINEPHRINE (PF) 0.25% -1:200000 IJ SOLN
INTRAMUSCULAR | Status: AC
  Filled 2023-05-20: qty 30

## 2023-05-20 MED ORDER — RISAQUAD PO CAPS
1.0000 | ORAL_CAPSULE | Freq: Every day | ORAL | Status: DC
  Administered 2023-05-20 – 2023-05-21 (×2): 1 via ORAL
  Filled 2023-05-20 (×2): qty 1

## 2023-05-20 MED ORDER — 0.9 % SODIUM CHLORIDE (POUR BTL) OPTIME
TOPICAL | Status: DC | PRN
  Administered 2023-05-20: 1000 mL

## 2023-05-20 MED ORDER — CEFAZOLIN SODIUM-DEXTROSE 2-4 GM/100ML-% IV SOLN
2.0000 g | INTRAVENOUS | Status: AC
  Administered 2023-05-20: 2 g via INTRAVENOUS
  Filled 2023-05-20: qty 100

## 2023-05-20 MED ORDER — METHOCARBAMOL 1000 MG/10ML IJ SOLN: 500.0000 mg | Freq: Four times a day (QID) | INTRAMUSCULAR | Status: DC | PRN

## 2023-05-20 MED ORDER — FENTANYL CITRATE PF 50 MCG/ML IJ SOSY
PREFILLED_SYRINGE | INTRAMUSCULAR | Status: AC
  Filled 2023-05-20: qty 1

## 2023-05-20 MED ORDER — METOCLOPRAMIDE HCL 5 MG PO TABS: 5.0000 mg | ORAL_TABLET | Freq: Three times a day (TID) | ORAL | Status: DC | PRN

## 2023-05-20 MED ORDER — DULOXETINE HCL 60 MG PO CPEP
60.0000 mg | ORAL_CAPSULE | Freq: Every day | ORAL | Status: DC
Start: 2023-05-20 — End: 2023-05-22
  Administered 2023-05-20 – 2023-05-21 (×2): 60 mg via ORAL
  Filled 2023-05-20 (×2): qty 1

## 2023-05-20 MED ORDER — FENTANYL CITRATE PF 50 MCG/ML IJ SOSY: 100.0000 ug | PREFILLED_SYRINGE | INTRAMUSCULAR | Status: DC

## 2023-05-20 MED ORDER — CHLORHEXIDINE GLUCONATE 0.12 % MT SOLN: 15.0000 mL | Freq: Once | OROMUCOSAL | Status: DC

## 2023-05-20 MED ORDER — WATER FOR IRRIGATION, STERILE IR SOLN
Status: DC | PRN
  Administered 2023-05-20: 1000 mL

## 2023-05-20 MED ORDER — SODIUM CHLORIDE 0.9% FLUSH
3.0000 mL | Freq: Two times a day (BID) | INTRAVENOUS | Status: DC
  Administered 2023-05-20: 10 mL via INTRAVENOUS
  Administered 2023-05-21: 5 mL via INTRAVENOUS
  Administered 2023-05-21: 10 mL via INTRAVENOUS
  Administered 2023-05-22: 5 mL via INTRAVENOUS

## 2023-05-20 MED ORDER — LACTATED RINGERS IV SOLN: INTRAVENOUS | Status: DC

## 2023-05-20 MED ORDER — PHENYLEPHRINE HCL (PRESSORS) 10 MG/ML IV SOLN
INTRAVENOUS | Status: AC
  Filled 2023-05-20: qty 1

## 2023-05-20 MED ORDER — PHENYLEPHRINE HCL (PRESSORS) 10 MG/ML IV SOLN
INTRAVENOUS | Status: AC
Start: 2023-05-20 — End: ?
  Filled 2023-05-20: qty 1

## 2023-05-20 MED ORDER — CLONIDINE HCL (ANALGESIA) 100 MCG/ML EP SOLN
EPIDURAL | Status: DC | PRN
  Administered 2023-05-20: 50 ug

## 2023-05-20 MED ORDER — ALBUMIN HUMAN 5 % IV SOLN
INTRAVENOUS | Status: AC
  Filled 2023-05-20: qty 250

## 2023-05-20 MED ORDER — ROPIVACAINE HCL 5 MG/ML IJ SOLN
INTRAMUSCULAR | Status: DC | PRN
Start: 2023-05-20 — End: 2023-05-20
  Administered 2023-05-20: 20 mL via PERINEURAL

## 2023-05-20 MED ORDER — LACTATED RINGERS IV SOLN
INTRAVENOUS | Status: DC | PRN
Start: 2023-05-20 — End: 2023-05-20

## 2023-05-20 MED ORDER — ASPIRIN 81 MG PO CHEW
81.0000 mg | CHEWABLE_TABLET | Freq: Two times a day (BID) | ORAL | Status: DC
  Administered 2023-05-20 – 2023-05-22 (×4): 81 mg via ORAL
  Filled 2023-05-20 (×5): qty 1

## 2023-05-20 SURGICAL SUPPLY — 45 items
ATTUNE PSFEM RTSZ5 NARCEM KNEE (Femur) IMPLANT
ATTUNE PSRP INSR SZ5 6 KNEE (Insert) IMPLANT
BAG COUNTER SPONGE SURGICOUNT (BAG) IMPLANT
BAG ZIPLOCK 12X15 (MISCELLANEOUS) IMPLANT
BASE TIBIAL ROT PLAT SZ 5 KNEE (Knees) IMPLANT
BLADE SAG 18X100X1.27 (BLADE) ×2 IMPLANT
BLADE SAW SGTL 13X75X1.27 (BLADE) ×2 IMPLANT
BNDG ELASTIC 6X10 VLCR STRL LF (GAUZE/BANDAGES/DRESSINGS) ×2 IMPLANT
BNDG GAUZE DERMACEA FLUFF 4 (GAUZE/BANDAGES/DRESSINGS) ×2 IMPLANT
BOWL SMART MIX CTS (DISPOSABLE) ×2 IMPLANT
CEMENT HV SMART SET (Cement) ×4 IMPLANT
COVER SURGICAL LIGHT HANDLE (MISCELLANEOUS) ×2 IMPLANT
CUFF TRNQT CYL 34X4.125X (TOURNIQUET CUFF) ×2 IMPLANT
DRAPE INCISE IOBAN 66X45 STRL (DRAPES) IMPLANT
DRAPE SHEET LG 3/4 BI-LAMINATE (DRAPES) ×2 IMPLANT
DRAPE U-SHAPE 47X51 STRL (DRAPES) ×2 IMPLANT
DRSG ADAPTIC 3X8 NADH LF (GAUZE/BANDAGES/DRESSINGS) ×2 IMPLANT
DURAPREP 26ML APPLICATOR (WOUND CARE) ×2 IMPLANT
ELECT REM PT RETURN 15FT ADLT (MISCELLANEOUS) ×2 IMPLANT
GAUZE PAD ABD 8X10 STRL (GAUZE/BANDAGES/DRESSINGS) ×2 IMPLANT
GAUZE SPONGE 4X4 12PLY STRL (GAUZE/BANDAGES/DRESSINGS) ×2 IMPLANT
GLOVE BIOGEL PI IND STRL 7.5 (GLOVE) ×2 IMPLANT
GLOVE BIOGEL PI IND STRL 8.5 (GLOVE) ×2 IMPLANT
GLOVE ORTHO TXT STRL SZ7.5 (GLOVE) ×2 IMPLANT
GLOVE SURG ORTHO 8.5 STRL (GLOVE) ×2 IMPLANT
GOWN STRL REUS W/ TWL XL LVL3 (GOWN DISPOSABLE) ×4 IMPLANT
IMMOBILIZER KNEE 20 (SOFTGOODS) ×1 IMPLANT
IMMOBILIZER KNEE 20 THIGH 36 (SOFTGOODS) ×2 IMPLANT
KIT TURNOVER KIT A (KITS) IMPLANT
MANIFOLD NEPTUNE II (INSTRUMENTS) ×2 IMPLANT
NS IRRIG 1000ML POUR BTL (IV SOLUTION) ×2 IMPLANT
PACK TOTAL KNEE CUSTOM (KITS) ×2 IMPLANT
PATELLA MEDIAL ATTUN 35MM KNEE (Knees) IMPLANT
PIN STEINMAN FIXATION KNEE (PIN) IMPLANT
PROTECTOR NERVE ULNAR (MISCELLANEOUS) ×2 IMPLANT
SET HNDPC FAN SPRY TIP SCT (DISPOSABLE) ×2 IMPLANT
STRIP CLOSURE SKIN 1/2X4 (GAUZE/BANDAGES/DRESSINGS) ×4 IMPLANT
SUT MNCRL AB 3-0 PS2 18 (SUTURE) ×2 IMPLANT
SUT VIC AB 0 CT1 36 (SUTURE) ×2 IMPLANT
SUT VIC AB 1 CT1 36 (SUTURE) ×4 IMPLANT
SUT VIC AB 2-0 CT1 TAPERPNT 27 (SUTURE) ×4 IMPLANT
TIBIAL BASE ROT PLAT SZ 5 KNEE (Knees) ×1 IMPLANT
TRAY CATH INTERMITTENT SS 16FR (CATHETERS) ×2 IMPLANT
WATER STERILE IRR 1000ML POUR (IV SOLUTION) ×4 IMPLANT
YANKAUER SUCT BULB TIP NO VENT (SUCTIONS) ×2 IMPLANT

## 2023-05-20 NOTE — Plan of Care (Signed)
  Problem: Education: Goal: Knowledge of the prescribed therapeutic regimen will improve Outcome: Progressing   Problem: Activity: Goal: Ability to avoid complications of mobility impairment will improve Outcome: Progressing   Problem: Pain Management: Goal: Pain level will decrease with appropriate interventions Outcome: Progressing   Problem: Education: Goal: Knowledge of the prescribed therapeutic regimen will improve Outcome: Progressing   Problem: Activity: Goal: Ability to avoid complications of mobility impairment will improve Outcome: Progressing   Problem: Pain Management: Goal: Pain level will decrease with appropriate interventions Outcome: Progressing

## 2023-05-20 NOTE — Anesthesia Preprocedure Evaluation (Signed)
 Anesthesia Evaluation  Patient identified by MRN, date of birth, ID band Patient awake    Reviewed: Allergy & Precautions, NPO status , Patient's Chart, lab work & pertinent test results, reviewed documented beta blocker date and time   History of Anesthesia Complications (+) PONV and history of anesthetic complications  Airway Mallampati: I  TM Distance: >3 FB Neck ROM: Full    Dental  (+) Teeth Intact, Dental Advisory Given   Pulmonary PE (1988)   Pulmonary exam normal breath sounds clear to auscultation       Cardiovascular hypertension, Pt. on home beta blockers and Pt. on medications Normal cardiovascular exam Rhythm:Regular Rate:Normal     Neuro/Psych  PSYCHIATRIC DISORDERS Anxiety Depression    S/p multilevel lumbar fusion     GI/Hepatic Neg liver ROS,GERD  ,,  Endo/Other  Hypothyroidism    Renal/GU negative Renal ROS     Musculoskeletal  (+) Arthritis ,  Right knee osteoarthrtis   Abdominal   Peds  Hematology negative hematology ROS (+)   Anesthesia Other Findings Day of surgery medications reviewed with the patient.  Reproductive/Obstetrics                             Anesthesia Physical Anesthesia Plan  ASA: 3  Anesthesia Plan: General   Post-op Pain Management: Regional block* and Tylenol PO (pre-op)*   Induction: Intravenous  PONV Risk Score and Plan: 4 or greater and Dexamethasone, Ondansetron and TIVA  Airway Management Planned: LMA  Additional Equipment:   Intra-op Plan:   Post-operative Plan: Extubation in OR  Informed Consent: I have reviewed the patients History and Physical, chart, labs and discussed the procedure including the risks, benefits and alternatives for the proposed anesthesia with the patient or authorized representative who has indicated his/her understanding and acceptance.     Dental advisory given  Plan Discussed with:  CRNA  Anesthesia Plan Comments:        Anesthesia Quick Evaluation

## 2023-05-20 NOTE — Op Note (Signed)
 NAME: Debbie Taylor, Debbie Taylor MEDICAL RECORD NO: [id number] ACCOUNT NO: [id number] DATE OF BIRTH: Dec 02, 1933 FACILITY: [facility]ATION: [id number] PHYSICIAN: Almedia Balls. Ranell Patrick, MD  Operative Report   DATE OF PROCEDURE: 05/20/2023  PREOPERATIVE DIAGNOSIS:  Right knee end-stage arthritis.  POSTOPERATIVE DIAGNOSIS:  Right knee end-stage arthritis.  PROCEDURE PERFORMED:  Right total knee arthroplasty using Depuy Attune prosthesis.  ATTENDING SURGEON:  Almedia Balls. Ranell Patrick, MD  ASSISTANT:  Konrad Felix Dixon, [place], who was scrubbed during the entire procedure, and necessary for satisfactory completion of surgery.  ANESTHESIA:  General anesthesia plus adductor canal block was utilized and local.  ESTIMATED BLOOD LOSS:  Minimal.  FLUID REPLACEMENT:  1000 mL of crystalloid.  INSTRUMENT COUNTS:  Correct.  COMPLICATIONS:  There were no complications.  ANTIBIOTICS:  Perioperative antibiotics were given.  TOURNIQUET TIME:  85 minutes at 300 mmHg.  INDICATIONS:  The patient is an 89 year old female who has had worsening right knee pain interfering with her mobility and activities of daily living as well as her independence.  We discussed options for management and given a failure of conservative  management over an extended period of time, we discussed and agreed upon total knee arthroplasty for the right knee.  The patient has a fixed flexion contracture interfering with gait.  Informed consent obtained.  DESCRIPTION OF PROCEDURE:  After an adequate level of anesthesia was achieved, the patient was positioned supine on the operating room table.  A nonsterile tourniquet was placed on the right proximal thigh.  The right leg was sterilely prepped and draped  in the usual manner.  Timeout called verifying the correct patient and correct site.  We elevated the leg and exsanguinated with an Esmarch bandage inflating the tourniquet to 300 mmHg.  We then placed the knee in flexion and performed a longitudinal   midline incision with a 10-blade scalpel.  Dissection down through the subcutaneous tissues using a fresh 10 blade scalpel and we used a fresh blade as we entered into the knee joint through a medial parapatellar arthrotomy.  We then divided lateral  patellofemoral ligaments everting the patella and exposing the distal femur, which was devoid of cartilage.  We entered the distal femur with a step-cut drill and resected 11 mm off the distal femur set on 5 degrees of valgus as the patient had a  significant flexion contracture.  We then sized the femur to size 5 anterior down performing anterior, posterior, and chamfer cuts with a 4-in-1 block.  We then went to the removed ACL and PCL meniscal tissue subluxing the tibia anteriorly.  We used an  external jig to cut the tibia 90 degrees perpendicular to the long axis of the tibia with minimal posterior slope for this posterior cruciate substituting prosthesis.  We resected 3 mm off the affected lateral side.  Once we had the tibial resection  done, we used a lamina spreader and removed the posterior femoral condyle osteophytes and also released the posterior capsule.  We injected the posterior capsule with a combination of Marcaine and Exparel and saline.  We checked our gaps, which were  symmetric at 5 mm.  We then finished our tibial preparation for the 5 tibia with a modular drill and keel punch trying to externally rotate the component as much as possible for patellar tracking purposes.  We cut the box for the 5 right narrow femur.   We then trialed with the 5 right narrow femur, drilled lug holes, and placed a 5-mm poly trial in place and  reduced the knee.  We had good stability both in flexion and extension.  We then resurfaced the patella going from a 28-mm thickness down to an  18-mm thickness.  We drilled lug holes for the patellar button.  We used a 35 patellar button in this place as the patient had some pretty bad erosions off on the lateral side of  the patella.  There was a cyst that we removed as well.  Once we had our  lug holes drilled for the patella and the trialing done with excellent patellar tracking with no-touch technique, we removed all the trial components.  We irrigated thoroughly with pulsatile lavage.  We dried the bone well and vacuum mixed high viscosity  cement on the back table.  We then cemented the components all into place in a single step including tibia, femur, and patella.  We placed the knee in extension with a 5-mm poly trial to compress the bone while the cement set up and also used a patellar  clamp on the patella.  Once all cement was hardened on the back table, we removed excess cement with a quarter-inch curved osteotome.  We inspected the entirety of the knee, irrigated thoroughly.  I then went ahead and selected a real size 5 6 mm poly  and placed on the tibial tray and reduced the knee.  Nice little pop as it reduced, very stable both in flexion and extension.  Normal patellar tracking.  We irrigated thoroughly.  I injected the anterior capsule with a combination of Marcaine, Exparel  and saline.  We then closed the parapatellar arthrotomy with #1 Vicryl suture followed by 2-0 Vicryl for subcutaneous closure and 4-0 running Monocryl for skin.  Steri-Strips were applied followed by a sterile dressing.  The patient tolerated the surgery  well.   PUS D: 05/20/2023 1:18:41 pm T: 05/20/2023 6:12:00 pm  JOB: [id number]

## 2023-05-20 NOTE — Evaluation (Signed)
 Physical Therapy Evaluation Patient Details Name: Debbie Taylor MRN: [id number] DOB: 27-Apr-1933 Today's Date: 05/20/2023  History of Present Illness  Pt s/p R TKR and with hx of back surgery and PE  Clinical Impression  Pt s/p R TKR and presents with decreased R LE strength/ROM and post op pain limiting functional mobility.   Pt should progress to dc home with family assist and reports first OP PT appt scheduled for 05/23/23.      If plan is discharge home, recommend the following: A little help with walking and/or transfers;A little help with bathing/dressing/bathroom;Assistance with cooking/housework;Assist for transportation;Help with stairs or ramp for entrance   Can travel by private vehicle        Equipment Recommendations Rolling walker (2 wheels)  Recommendations for Other Services       Functional Status Assessment Patient has had a recent decline in their functional status and demonstrates the ability to make significant improvements in function in a reasonable and predictable amount of time.     Precautions / Restrictions Precautions Precautions: Knee;Fall Restrictions Weight Bearing Restrictions Per Provider Order: No Other Position/Activity Restrictions: WBAT      Mobility  Bed Mobility Overal bed mobility: Needs Assistance Bed Mobility: Supine to Sit     Supine to sit: Min assist     General bed mobility comments: Increased time with cues for sequence and use of LE LE to self assist    Transfers Overall transfer level: Needs assistance Equipment used: Rolling walker (2 wheels) Transfers: Sit to/from Stand Sit to Stand: Min assist           General transfer comment: cues for LE management and use of UEs to self assist    Ambulation/Gait Ambulation/Gait assistance: Min assist Gait Distance (Feet): 26 Feet Assistive device: Rolling walker (2 wheels) Gait Pattern/deviations: Step-to pattern, Decreased step length - right, Decreased step  length - left, Shuffle, Trunk flexed Gait velocity: decr     General Gait Details: cues for sequence, posture and position from [employer]            Wheelchair Mobility     Tilt Bed    Modified Rankin (Stroke Patients Only)       Balance Overall balance assessment: Needs assistance Sitting-balance support: No upper extremity supported, Feet supported Sitting balance-Leahy Scale: Good     Standing balance support: Bilateral upper extremity supported Standing balance-Leahy Scale: Poor                               Pertinent Vitals/Pain Pain Assessment Pain Assessment: 0-10 Pain Score: 5  Pain Location: R knee Pain Descriptors / Indicators: Aching, Sore Pain Intervention(s): Limited activity within patient's tolerance, Monitored during session, Premedicated before session, Ice applied    Home Living Family/patient expects to be discharged to:: Private residence Living Arrangements: Spouse/significant other Available Help at Discharge: Available 24 hours/day Type of Home: House Home Access: Stairs to enter Entrance Stairs-Rails: Right Entrance Stairs-Number of Steps: 3 Alternate Level Stairs-Number of Steps: 7 Home Layout: Multi-level Home Equipment: BSC/3in1      Prior Function Prior Level of Function : Independent/Modified Independent                     Extremity/Trunk Assessment   Upper Extremity Assessment Upper Extremity Assessment: Overall WFL for tasks assessed    Lower Extremity Assessment Lower Extremity Assessment: RLE deficits/detail    Cervical / Trunk  Assessment Cervical / Trunk Assessment: Normal  Communication   Communication Communication: Impaired Factors Affecting Communication: Hearing impaired    Cognition Arousal: Alert Behavior During Therapy: WFL for tasks assessed/performed   PT - Cognitive impairments: No apparent impairments                         Following commands: Intact        Cueing Cueing Techniques: Verbal cues, Gestural cues     General Comments      Exercises Total Joint Exercises Ankle Circles/Pumps: AROM, Both, 15 reps, Supine   Assessment/Plan    PT Assessment Patient needs continued PT services  PT Problem List Decreased strength;Decreased range of motion;Decreased activity tolerance;Decreased balance;Decreased mobility;Decreased knowledge of use of DME;Pain       PT Treatment Interventions DME instruction;Gait training;Stair training;Functional mobility training;Therapeutic activities;Therapeutic exercise;Patient/family education    PT Goals (Current goals can be found in the Care Plan section)  Acute Rehab PT Goals Patient Stated Goal: Regain IND and get off this walker PT Goal Formulation: With patient Time For Goal Achievement: 06/02/23 Potential to Achieve Goals: Good    Frequency 7X/week     Co-evaluation               AM-PAC PT "6 Clicks" Mobility  Outcome Measure Help needed turning from your back to your side while in a flat bed without using bedrails?: A Little Help needed moving from lying on your back to sitting on the side of a flat bed without using bedrails?: A Little Help needed moving to and from a bed to a chair (including a wheelchair)?: A Little Help needed standing up from a chair using your arms (e.g., wheelchair or bedside chair)?: A Little Help needed to walk in hospital room?: A Little Help needed climbing 3-5 steps with a railing? : A Lot 6 Click Score: 17    End of Session Equipment Utilized During Treatment: Gait belt;Right knee immobilizer Activity Tolerance: Patient tolerated treatment well Patient left: in chair;with call bell/phone within reach;with chair alarm set;with family/visitor present Nurse Communication: Mobility status PT Visit Diagnosis: Difficulty in walking, not elsewhere classified (R26.2)    Time: 1610-9604 PT Time Calculation (min) (ACUTE ONLY): 27 min   Charges:    PT Evaluation $PT Eval Low Complexity: 1 Low PT Treatments $Gait Training: 8-22 mins PT General Charges $$ ACUTE PT VISIT: 1 Visit         Mauro Kaufmann PT Acute Rehabilitation Services Pager [phone] Office [phone]   Debbie Taylor 05/20/2023, 4:24 PM

## 2023-05-20 NOTE — Anesthesia Procedure Notes (Signed)
 Anesthesia Regional Block: Adductor canal block   Pre-Anesthetic Checklist: , timeout performed,  Correct Patient, Correct Site, Correct Laterality,  Correct Procedure, Correct Position, site marked,  Risks and benefits discussed,  Surgical consent,  Pre-op evaluation,  At surgeon's request and post-op pain management  Laterality: Right  Prep: chloraprep       Needles:  Injection technique: Single-shot  Needle Type: Echogenic Needle     Needle Length: 9cm  Needle Gauge: 21     Additional Needles:   Procedures:,,,, ultrasound used (permanent image in chart),,    Narrative:  Start time: 05/20/2023 9:30 AM End time: 05/20/2023 9:37 AM Injection made incrementally with aspirations every 5 mL.  Performed by: Personally  Anesthesiologist: Collene Schlichter, MD  Additional Notes: No pain on injection. No increased resistance to injection. Injection made in 5cc increments.  Good needle visualization.  Patient tolerated procedure well.

## 2023-05-20 NOTE — Interval H&P Note (Signed)
 History and Physical Interval Note:  05/20/2023 9:01 AM  Debbie Taylor  has presented today for surgery, with the diagnosis of Right knee osteoarthrtis.  The various methods of treatment have been discussed with the patient and family. After consideration of risks, benefits and other options for treatment, the patient has consented to  Procedure(s): ARTHROPLASTY, KNEE, TOTAL (Right) as a surgical intervention.  The patient's history has been reviewed, patient examined, no change in status, stable for surgery.  I have reviewed the patient's chart and labs.  Questions were answered to the patient's satisfaction.     [name]

## 2023-05-20 NOTE — Progress Notes (Signed)
 Orthopedic Tech Progress Note Patient Details:  Debbie Taylor 11-Sep-1933 [id number] CPM will be removed at 1930.  CPM Right Knee CPM Right Knee: On Right Knee Flexion (Degrees): 65 Right Knee Extension (Degrees): 0  Post Interventions Patient Tolerated: Well Ortho Devices Type of Ortho Device: Bone foam zero knee Ortho Device/Splint Location: Right knee Ortho Device/Splint Interventions: Application   Post Interventions Patient Tolerated: Well  Debbie Taylor 05/20/2023, 3:41 PM

## 2023-05-20 NOTE — Discharge Instructions (Signed)
 Ice to the knee constantly.  Keep the incision covered and clean and dry for one week !!  Do exercise as instructed every hour, please to prevent stiffness.   DO NOT prop anything under the knee, it will make your knee stiff.  Prop under the ankle to encourage your knee to go straight.   Keep the Aquacel bandage on for one week (next Friday) then ok to remove and leave the incision open to air and ok to get the incision wet in the shower.  Use the walker while you are up and around for balance.  Wear your support stockings 24/7 to prevent blood clots and take baby aspirin twice daily for 30 days also to prevent blood clots  Follow up with Dr Ranell Patrick in two weeks in the office, call [phone] for appt  Please call Dr Ranell Patrick (cell) [phone] with ANY questions or concerns  INSTRUCTIONS AFTER JOINT REPLACEMENT   Remove items at home which could result in a fall. This includes throw rugs or furniture in walking pathways ICE to the affected joint every three hours while awake for 30 minutes at a time, for at least the first 3-5 days, and then as needed for pain and swelling.  Continue to use ice for pain and swelling. You may notice swelling that will progress down to the foot and ankle.  This is normal after surgery.  Elevate your leg when you are not up walking on it.   Continue to use the breathing machine you got in the hospital (incentive spirometer) which will help keep your temperature down.  It is common for your temperature to cycle up and down following surgery, especially at night when you are not up moving around and exerting yourself.  The breathing machine keeps your lungs expanded and your temperature down.   DIET:  As you were doing prior to hospitalization, we recommend a well-balanced diet.  DRESSING / WOUND CARE / SHOWERING  You may change your dressing 3-5 days after surgery.  Then change the dressing every day with sterile gauze.  Please use good hand washing techniques  before changing the dressing.  Do not use any lotions or creams on the incision until instructed by your surgeon.  ACTIVITY  Increase activity slowly as tolerated, but follow the weight bearing instructions below.   No driving for 6 weeks or until further direction given by your physician.  You cannot drive while taking narcotics.  No lifting or carrying greater than 10 lbs. until further directed by your surgeon. Avoid periods of inactivity such as sitting longer than an hour when not asleep. This helps prevent blood clots.  You may return to work once you are authorized by your doctor.     WEIGHT BEARING   Weight bearing as tolerated with assist device (walker, cane, etc) as directed, use it as long as suggested by your surgeon or therapist, typically at least 4-6 weeks.   EXERCISES  Results after joint replacement surgery are often greatly improved when you follow the exercise, range of motion and muscle strengthening exercises prescribed by your doctor. Safety measures are also important to protect the joint from further injury. Any time any of these exercises cause you to have increased pain or swelling, decrease what you are doing until you are comfortable again and then slowly increase them. If you have problems or questions, call your caregiver or physical therapist for advice.   Rehabilitation is important following a joint replacement. After just a  few days of immobilization, the muscles of the leg can become weakened and shrink (atrophy).  These exercises are designed to build up the tone and strength of the thigh and leg muscles and to improve motion. Often times heat used for twenty to thirty minutes before working out will loosen up your tissues and help with improving the range of motion but do not use heat for the first two weeks following surgery (sometimes heat can increase post-operative swelling).   These exercises can be done on a training (exercise) mat, on the floor, on  a table or on a bed. Use whatever works the best and is most comfortable for you.    Use music or television while you are exercising so that the exercises are a pleasant break in your day. This will make your life better with the exercises acting as a break in your routine that you can look forward to.   Perform all exercises about fifteen times, three times per day or as directed.  You should exercise both the operative leg and the other leg as well.  Exercises include:   Quad Sets - Tighten up the muscle on the front of the thigh (Quad) and hold for 5-10 seconds.   Straight Leg Raises - With your knee straight (if you were given a brace, keep it on), lift the leg to 60 degrees, hold for 3 seconds, and slowly lower the leg.  Perform this exercise against resistance later as your leg gets stronger.  Leg Slides: Lying on your back, slowly slide your foot toward your buttocks, bending your knee up off the floor (only go as far as is comfortable). Then slowly slide your foot back down until your leg is flat on the floor again.  Angel Wings: Lying on your back spread your legs to the side as far apart as you can without causing discomfort.  Hamstring Strength:  Lying on your back, push your heel against the floor with your leg straight by tightening up the muscles of your buttocks.  Repeat, but this time bend your knee to a comfortable angle, and push your heel against the floor.  You may put a pillow under the heel to make it more comfortable if necessary.   A rehabilitation program following joint replacement surgery can speed recovery and prevent re-injury in the future due to weakened muscles. Contact your doctor or a physical therapist for more information on knee rehabilitation.    CONSTIPATION  Constipation is defined medically as fewer than three stools per week and severe constipation as less than one stool per week.  Even if you have a regular bowel pattern at home, your normal regimen is  likely to be disrupted due to multiple reasons following surgery.  Combination of anesthesia, postoperative narcotics, change in appetite and fluid intake all can affect your bowels.   YOU MUST use at least one of the following options; they are listed in order of increasing strength to get the job done.  They are all available over the counter, and you may need to use some, POSSIBLY even all of these options:    Drink plenty of fluids (prune juice may be helpful) and high fiber foods Colace 100 mg by mouth twice a day  Senokot for constipation as directed and as needed Dulcolax (bisacodyl), take with full glass of water  Miralax (polyethylene glycol) once or twice a day as needed.  If you have tried all these things and are unable to have a  bowel movement in the first 3-4 days after surgery call either your surgeon or your primary doctor.    If you experience loose stools or diarrhea, hold the medications until you stool forms back up.  If your symptoms do not get better within 1 week or if they get worse, check with your doctor.  If you experience "the worst abdominal pain ever" or develop nausea or vomiting, please contact the office immediately for further recommendations for treatment.   ITCHING:  If you experience itching with your medications, try taking only a single pain pill, or even half a pain pill at a time.  You can also use Benadryl over the counter for itching or also to help with sleep.   TED HOSE STOCKINGS:  Use stockings on both legs until for at least 2 weeks or as directed by physician office. They may be removed at night for sleeping.  MEDICATIONS:  See your medication summary on the "After Visit Summary" that nursing will review with you.  You may have some home medications which will be placed on hold until you complete the course of blood thinner medication.  It is important for you to complete the blood thinner medication as prescribed.  PRECAUTIONS:  If you experience  chest pain or shortness of breath - call 911 immediately for transfer to the hospital emergency department.   If you develop a fever greater that 101 F, purulent drainage from wound, increased redness or drainage from wound, foul odor from the wound/dressing, or calf pain - CONTACT YOUR SURGEON.                                                   FOLLOW-UP APPOINTMENTS:  If you do not already have a post-op appointment, please call the office for an appointment to be seen by your surgeon.  Guidelines for how soon to be seen are listed in your "After Visit Summary", but are typically between 1-4 weeks after surgery.  OTHER INSTRUCTIONS:   Knee Replacement:  Do not place pillow under knee, focus on keeping the knee straight while resting. CPM instructions: 0-90 degrees, 2 hours in the morning, 2 hours in the afternoon, and 2 hours in the evening. Place foam block, curve side up under heel at all times except when in CPM or when walking.  DO NOT modify, tear, cut, or change the foam block in any way.  POST-OPERATIVE OPIOID TAPER INSTRUCTIONS: It is important to wean off of your opioid medication as soon as possible. If you do not need pain medication after your surgery it is ok to stop day one. Opioids include: Codeine, Hydrocodone(Norco, Vicodin), Oxycodone(Percocet, oxycontin) and hydromorphone amongst others.  Long term and even short term use of opiods can cause: Increased pain response Dependence Constipation Depression Respiratory depression And more.  Withdrawal symptoms can include Flu like symptoms Nausea, vomiting And more Techniques to manage these symptoms Hydrate well Eat regular healthy meals Stay active Use relaxation techniques(deep breathing, meditating, yoga) Do Not substitute Alcohol to help with tapering If you have been on opioids for less than two weeks and do not have pain than it is ok to stop all together.  Plan to wean off of opioids This plan should start within  one week post op of your joint replacement. Maintain the same interval or time between taking each  dose and first decrease the dose.  Cut the total daily intake of opioids by one tablet each day Next start to increase the time between doses. The last dose that should be eliminated is the evening dose.   MAKE SURE YOU:  Understand these instructions.  Get help right away if you are not doing well or get worse.    Thank you for letting us be a part of your medical care team.  It is a privilege we respect greatly.  We hope these instructions will help you stay on track for a fast and full recovery!

## 2023-05-20 NOTE — Transfer of Care (Signed)
 Immediate Anesthesia Transfer of Care Note  Patient: Debbie Taylor  Procedure(s) Performed: ARTHROPLASTY, KNEE, TOTAL (Right: Knee)  Patient Location: PACU  Anesthesia Type:General  Level of Consciousness: sedated and responds to stimulation  Airway & Oxygen Therapy: Patient Spontanous Breathing and Patient connected to face mask oxygen  Post-op Assessment: Report given to RN and Post -op Vital signs reviewed and stable  Post vital signs: Reviewed and stable  Last Vitals:  Vitals Value Taken Time  BP 134/62 05/20/23 1311  Temp    Pulse 72 05/20/23 1312  Resp 17 05/20/23 1312  SpO2 99 % 05/20/23 1312  Vitals shown include unfiled device data.  Last Pain:  Vitals:   05/20/23 1000  TempSrc:   PainSc: (P) 0-No pain         Complications: No notable events documented.

## 2023-05-20 NOTE — Anesthesia Procedure Notes (Signed)
 Procedure Name: LMA Insertion Date/Time: 05/20/2023 11:10 AM  Performed by: Micki Riley, CRNAPre-anesthesia Checklist: Patient identified, Emergency Drugs available, Suction available and Patient being monitored Patient Re-evaluated:Patient Re-evaluated prior to induction Oxygen Delivery Method: Circle System Utilized Preoxygenation: Pre-oxygenation with 100% oxygen Induction Type: IV induction Ventilation: Mask ventilation without difficulty LMA: LMA inserted LMA Size: 4.0 Number of attempts: 1 Airway Equipment and Method: Bite block Placement Confirmation: positive ETCO2 and breath sounds checked- equal and bilateral Tube secured with: Tape Dental Injury: Teeth and Oropharynx as per pre-operative assessment

## 2023-05-20 NOTE — Anesthesia Postprocedure Evaluation (Signed)
 Anesthesia Post Note  Patient: Debbie Taylor  Procedure(s) Performed: ARTHROPLASTY, KNEE, TOTAL (Right: Knee)     Patient location during evaluation: Nursing Unit Anesthesia Type: General Level of consciousness: awake and alert Pain management: pain level controlled Vital Signs Assessment: post-procedure vital signs reviewed and stable Respiratory status: spontaneous breathing, nonlabored ventilation and respiratory function stable Cardiovascular status: blood pressure returned to baseline and stable Postop Assessment: no apparent nausea or vomiting Anesthetic complications: no   No notable events documented.  Last Vitals:  Vitals:   05/20/23 1422 05/20/23 1430  BP:  (!) 159/76  Pulse: 71 70  Resp: 19 15  Temp:    SpO2: 98% 96%    Last Pain:  Vitals:   05/20/23 1422  TempSrc:   PainSc: Asleep                 Collene Schlichter

## 2023-05-20 NOTE — Brief Op Note (Signed)
 05/20/2023  1:12 PM  PATIENT:  Debbie Taylor  89 y.o. female  PRE-OPERATIVE DIAGNOSIS:  Right knee osteoarthrtis, end stage  POST-OPERATIVE DIAGNOSIS: Right knee osteoarthritis, end stage  PROCEDURE:  Procedure(s): ARTHROPLASTY, KNEE, TOTAL (Right) DePuy Attune  SURGEON:  Surgeons and Role:    Beverely Low, MD - Primary  PHYSICIAN ASSISTANT:   ASSISTANTS: Thea Gist, PA-C   ANESTHESIA:   regional and general  EBL:  minimal  BLOOD ADMINISTERED:none  DRAINS: none   LOCAL MEDICATIONS USED:  MARCAINE     SPECIMEN:  No Specimen  DISPOSITION OF SPECIMEN:  N/A  COUNTS:  YES  TOURNIQUET:   Total Tourniquet Time Documented: Thigh (Right) - 85 minutes Total: Thigh (Right) - 85 minutes   DICTATION: .Other Dictation: Dictation Number [id number]  PLAN OF CARE: Admit for overnight observation  PATIENT DISPOSITION:  PACU - hemodynamically stable.   Delay start of Pharmacological VTE agent (>24hrs) due to surgical blood loss or risk of bleeding: no

## 2023-05-20 NOTE — Progress Notes (Signed)
 Attempted to call daughter Melody back but phone kept going to voicemail.

## 2023-05-21 DIAGNOSIS — M1711 Unilateral primary osteoarthritis, right knee: Secondary | ICD-10-CM | POA: Diagnosis not present

## 2023-05-21 LAB — CBC
HCT: 34.9 % — ABNORMAL LOW (ref 36.0–46.0)
Hemoglobin: 11.1 g/dL — ABNORMAL LOW (ref 12.0–15.0)
MCH: 31.1 pg (ref 26.0–34.0)
MCHC: 31.8 g/dL (ref 30.0–36.0)
MCV: 97.8 fL (ref 80.0–100.0)
Platelets: 219 10*3/uL (ref 150–400)
RBC: 3.57 MIL/uL — ABNORMAL LOW (ref 3.87–5.11)
RDW: 13.3 % (ref 11.5–15.5)
WBC: 11.7 10*3/uL — ABNORMAL HIGH (ref 4.0–10.5)
nRBC: 0 % (ref 0.0–0.2)

## 2023-05-21 LAB — BASIC METABOLIC PANEL WITH GFR
Anion gap: 8 (ref 5–15)
BUN: 27 mg/dL — ABNORMAL HIGH (ref 8–23)
CO2: 22 mmol/L (ref 22–32)
Calcium: 8.5 mg/dL — ABNORMAL LOW (ref 8.9–10.3)
Chloride: 108 mmol/L (ref 98–111)
Creatinine, Ser: 1.19 mg/dL — ABNORMAL HIGH (ref 0.44–1.00)
GFR, Estimated: 44 mL/min — ABNORMAL LOW (ref >60)
Glucose, Bld: 152 mg/dL — ABNORMAL HIGH (ref 70–99)
Potassium: 4.1 mmol/L (ref 3.5–5.1)
Sodium: 138 mmol/L (ref 135–145)

## 2023-05-21 NOTE — Progress Notes (Signed)
 Orthopedic Tech Progress Note Patient Details:  Sherisa [facility] 09-Aug-1933 [id number]  CPM Right Knee CPM Right Knee: On Right Knee Flexion (Degrees): 80 Right Knee Extension (Degrees): 0  Post Interventions Patient Tolerated: Well  Tonye Pearson 05/21/2023, 4:05 PM

## 2023-05-21 NOTE — Progress Notes (Signed)
 Subjective: 1 Day Post-Op Procedure(s) (LRB): ARTHROPLASTY, KNEE, TOTAL (Right)  Patient reports pain as appropriately controlled. Denies any new numbness/tingling.   Objective:   VITALS:  Temp:  [97.5 F (36.4 C)-98.2 F (36.8 C)] 98.2 F (36.8 C) (03/29 0550) Pulse Rate:  [63-80] 72 (03/29 0550) Resp:  [13-24] 16 (03/29 0550) BP: (107-178)/(59-91) 145/71 (03/29 0550) SpO2:  [95 %-100 %] 99 % (03/29 0550)  Gen: AAOx3, NAD Comfortable at rest  Right Lower Extremity: Dressings intact ADF/APF/EHL 5/5 SILT throughout DP, PT 2+ to palp CR < 2s    LABS Recent Labs    05/21/23 0346  HGB 11.1*  WBC 11.7*  PLT 219   Recent Labs    05/21/23 0346  NA 138  K 4.1  CL 108  CO2 22  BUN 27*  CREATININE 1.19*  GLUCOSE 152*   No results for input(s): "LABPT", "INR" in the last 72 hours.   Assessment/Plan: 1 Day Post-Op Procedure(s) (LRB): ARTHROPLASTY, KNEE, TOTAL (Right)  Advance diet Up with therapy D/C IV fluids Discharge home with home health  Netta Cedars 05/21/2023, 9:39 AM

## 2023-05-21 NOTE — Care Management Obs Status (Signed)
 MEDICARE OBSERVATION STATUS NOTIFICATION   Patient Details  Name: Debbie Taylor MRN: [id number] Date of Birth: September 21, 1933   Medicare Observation Status Notification Given:       ANALEA MULLER, RN 05/21/2023, 10:46 AM

## 2023-05-21 NOTE — Progress Notes (Signed)
 Orthopedic Tech Progress Note Patient Details:  Rakeb Kibble [facility] [birth year] [id number]  CPM Right Knee CPM Right Knee: Off Right Knee Flexion (Degrees): 80 Right Knee Extension (Degrees): 0  Post Interventions Patient Tolerated: Well  Tonye Pearson 05/21/2023, 6:50 PM

## 2023-05-21 NOTE — Plan of Care (Signed)

## 2023-05-21 NOTE — TOC Progression Note (Signed)
 Transition of Care [facility]) - Progression Note    Patient Details  Name: [name] MRN: [id number] Date of Birth: [birth year]  Transition of Care [facility]) CM/SW Contact  Howell Rucks, RN Phone Number: 05/21/2023, 12:44 PM  Clinical Narrative:   Met with Debbie Taylor and family at bedside to review dc therapy and home equipment needs, family confirmed OPPT with EO. needs RW, requesting quad cane. Request sent to Dr Odis Hollingshead for orders. Call to [facility] , [place], accepted referral , needs orders. Teams chat sent to Dr Odis Hollingshead for RW and quad cane order. MOON completed.         Expected Discharge Plan and Services         Expected Discharge Date: 05/21/23                                     Social Determinants of Health (SDOH) Interventions SDOH Screenings   Food Insecurity: No Food Insecurity (05/20/2023)  Housing: Unknown (05/20/2023)  Transportation Needs: No Transportation Needs (05/20/2023)  Utilities: Not At Risk (05/20/2023)  Social Connections: Moderately Isolated (05/20/2023)  Tobacco Use: Low Risk  (05/20/2023)    Readmission Risk Interventions     No data to display

## 2023-05-21 NOTE — Progress Notes (Signed)
 Physical Therapy Treatment Patient Details Name: Debbie Taylor MRN: [id number] DOB: [birth year] Today's Date: 05/21/2023   History of Present Illness Pt s/p R TKR and with hx of back surgery and PE    PT Comments  Pt continues cooperative and up to ambulate to hall and negotiated 3 stairs.  Pt becoming diaphoretic and c/o dizziness on stairs and transferred to recliner as soon as descent completed.  BP in chair 94/49 - RN in to assess. Dtr present for session and expressing concerns reporting pt has 3+7 stairs to enter house.   If plan is discharge home, recommend the following: A little help with walking and/or transfers;A little help with bathing/dressing/bathroom;Assistance with cooking/housework;Assist for transportation;Help with stairs or ramp for entrance   Can travel by private vehicle        Equipment Recommendations  Rolling walker (2 wheels)    Recommendations for Other Services       Precautions / Restrictions Precautions Precautions: Knee;Fall Restrictions Weight Bearing Restrictions Per Provider Order: No Other Position/Activity Restrictions: WBAT     Mobility  Bed Mobility Overal bed mobility: Needs Assistance Bed Mobility: Supine to Sit     Supine to sit: Contact guard     General bed mobility comments: Increased time with cues for sequence and use of LE LE to self assist    Transfers Overall transfer level: Needs assistance Equipment used: Rolling walker (2 wheels) Transfers: Sit to/from Stand Sit to Stand: Contact guard assist, Supervision           General transfer comment: cues for LE management and use of UEs to self assist    Ambulation/Gait Ambulation/Gait assistance: Min assist, Contact guard assist Gait Distance (Feet): 18 Feet Assistive device: Rolling walker (2 wheels) Gait Pattern/deviations: Step-to pattern, Decreased step length - right, Decreased step length - left, Shuffle, Trunk flexed Gait velocity: decr     General  Gait Details: cues for sequence, posture and position from RW   Stairs Stairs: Yes Stairs assistance: Min assist Stair Management: One rail Right, Forwards, With crutches, Step to pattern Number of Stairs: 3 General stair comments: cues for sequence and foot/cane placement;   Wheelchair Mobility     Tilt Bed    Modified Rankin (Stroke Patients Only)       Balance Overall balance assessment: Needs assistance Sitting-balance support: No upper extremity supported, Feet supported Sitting balance-Leahy Scale: Good     Standing balance support: Single extremity supported Standing balance-Leahy Scale: Poor                              Communication Communication Communication: Impaired Factors Affecting Communication: Hearing impaired  Cognition Arousal: Alert Behavior During Therapy: WFL for tasks assessed/performed   PT - Cognitive impairments: No apparent impairments                         Following commands: Intact      Cueing Cueing Techniques: Verbal cues, Gestural cues  Exercises Total Joint Exercises Ankle Circles/Pumps: AROM, Both, 15 reps, Supine Quad Sets: AROM, Both, 10 reps, Supine Heel Slides: AAROM, Right, 15 reps, Supine Straight Leg Raises: AAROM, AROM, Right, 10 reps, Supine Goniometric ROM: AAROM R knee -6 - 45    General Comments        Pertinent Vitals/Pain Pain Assessment Pain Assessment: 0-10 Pain Score: 7  Pain Location: R knee Pain Descriptors / Indicators: Aching, Sore Pain Intervention(s):  Limited activity within patient's tolerance, Monitored during session, Premedicated before session, Ice applied    Home Living                          Prior Function            PT Goals (current goals can now be found in the care plan section) Acute Rehab PT Goals Patient Stated Goal: Regain IND and get off this walker PT Goal Formulation: With patient Time For Goal Achievement: 06/02/23 Potential to  Achieve Goals: Good Progress towards PT goals: Progressing toward goals    Frequency    7X/week      PT Plan      Co-evaluation              AM-PAC PT "6 Clicks" Mobility   Outcome Measure  Help needed turning from your back to your side while in a flat bed without using bedrails?: A Little Help needed moving from lying on your back to sitting on the side of a flat bed without using bedrails?: A Little Help needed moving to and from a bed to a chair (including a wheelchair)?: A Little Help needed standing up from a chair using your arms (e.g., wheelchair or bedside chair)?: A Little Help needed to walk in hospital room?: A Little Help needed climbing 3-5 steps with a railing? : A Little 6 Click Score: 18    End of Session Equipment Utilized During Treatment: Gait belt Activity Tolerance: Patient tolerated treatment well Patient left: in chair;with call bell/phone within reach Nurse Communication: Mobility status;Other (comment) (orthostatic) PT Visit Diagnosis: Difficulty in walking, not elsewhere classified (R26.2)     Time: 1130-1202 PT Time Calculation (min) (ACUTE ONLY): 32 min  Charges:    $Gait Training: 8-22 mins $Therapeutic Activity: 8-22 mins PT General Charges $$ ACUTE PT VISIT: 1 Visit                     Mauro Kaufmann PT Acute Rehabilitation Services Pager [phone] Office [phone]    Loveda Colaizzi 05/21/2023, 12:19 PM

## 2023-05-21 NOTE — Progress Notes (Signed)
 Physical Therapy Treatment Patient Details Name: Debbie Taylor MRN: [id number] DOB: 1933/04/19 Today's Date: 05/21/2023   History of Present Illness Pt s/p R TKR and with hx of back surgery and PE    PT Comments  Pt with noted improvement in activity tolerance this pm - reports continues difficulty with pain but no dizziness with mobility.  Pt hopeful for dc home tomorrow.    If plan is discharge home, recommend the following: A little help with walking and/or transfers;A little help with bathing/dressing/bathroom;Assistance with cooking/housework;Assist for transportation;Help with stairs or ramp for entrance   Can travel by private vehicle        Equipment Recommendations  Rolling walker (2 wheels)    Recommendations for Other Services       Precautions / Restrictions Precautions Precautions: Knee;Fall Restrictions Weight Bearing Restrictions Per Provider Order: No Other Position/Activity Restrictions: WBAT     Mobility  Bed Mobility Overal bed mobility: Needs Assistance Bed Mobility: Supine to Sit, Sit to Supine     Supine to sit: Contact guard Sit to supine: Min assist   General bed mobility comments: Increased time with cues for sequence and use of LE LE to self assist    Transfers Overall transfer level: Needs assistance Equipment used: Rolling walker (2 wheels) Transfers: Sit to/from Stand, Bed to chair/wheelchair/BSC Sit to Stand: Contact guard assist, Supervision   Step pivot transfers: Min assist       General transfer comment: cues for LE management and use of UEs to self assist; step pvt recliner to EOB    Ambulation/Gait Ambulation/Gait assistance: Min assist, Contact guard assist Gait Distance (Feet): 48 Feet Assistive device: Rolling walker (2 wheels) Gait Pattern/deviations: Step-to pattern, Decreased step length - right, Decreased step length - left, Shuffle, Trunk flexed Gait velocity: decr     General Gait Details: cues for  sequence, posture and position from RW   Stairs Stairs: Yes Stairs assistance: Min assist Stair Management: One rail Right, Forwards, Step to pattern, With cane Number of Stairs: 5 General stair comments: cues for sequence and foot/cane placement;   Wheelchair Mobility     Tilt Bed    Modified Rankin (Stroke Patients Only)       Balance Overall balance assessment: Needs assistance Sitting-balance support: No upper extremity supported, Feet supported Sitting balance-Leahy Scale: Good     Standing balance support: Single extremity supported Standing balance-Leahy Scale: Poor                              Communication Communication Communication: Impaired Factors Affecting Communication: Hearing impaired  Cognition Arousal: Alert Behavior During Therapy: WFL for tasks assessed/performed   PT - Cognitive impairments: No apparent impairments                         Following commands: Intact      Cueing Cueing Techniques: Verbal cues, Gestural cues  Exercises      General Comments        Pertinent Vitals/Pain Pain Assessment Pain Assessment: 0-10 Pain Score: 7  Pain Location: R knee Pain Descriptors / Indicators: Aching, Sore Pain Intervention(s): Limited activity within patient's tolerance, Monitored during session, Premedicated before session, Ice applied    Home Living                          Prior Function  PT Goals (current goals can now be found in the care plan section) Acute Rehab PT Goals Patient Stated Goal: Regain IND and get off this walker PT Goal Formulation: With patient Time For Goal Achievement: 06/02/23 Potential to Achieve Goals: Good Progress towards PT goals: Progressing toward goals    Frequency    7X/week      PT Plan      Co-evaluation              AM-PAC PT "6 Clicks" Mobility   Outcome Measure  Help needed turning from your back to your side while in a flat  bed without using bedrails?: A Little Help needed moving from lying on your back to sitting on the side of a flat bed without using bedrails?: A Little Help needed moving to and from a bed to a chair (including a wheelchair)?: A Little Help needed standing up from a chair using your arms (e.g., wheelchair or bedside chair)?: A Little Help needed to walk in hospital room?: A Little Help needed climbing 3-5 steps with a railing? : A Little 6 Click Score: 18    End of Session Equipment Utilized During Treatment: Gait belt Activity Tolerance: Patient tolerated treatment well Patient left: in bed;with call bell/phone within reach;with nursing/sitter in room Nurse Communication: Mobility status;Other (comment) PT Visit Diagnosis: Difficulty in walking, not elsewhere classified (R26.2)     Time: 1191-4782 PT Time Calculation (min) (ACUTE ONLY): 36 min  Charges:    $Gait Training: 8-22 mins $Therapeutic Activity: 8-22 mins PT General Charges $$ ACUTE PT VISIT: 1 Visit                     Mauro Kaufmann PT Acute Rehabilitation Services Pager [phone] Office [phone]     [facility] 05/21/2023, 4:34 PM

## 2023-05-21 NOTE — Progress Notes (Signed)
 Physical Therapy Treatment Patient Details Name: Debbie Taylor MRN: [id number] DOB: 11-25-1933 Today's Date: 05/21/2023   History of Present Illness Pt s/p R TKR and with hx of back surgery and PE    PT Comments  Pt very cooperative and progressing with mobility with noted decreased assist for most tasks and increased activity tolerance.  HEP initiated and pt up to ambulate 7' in hall - pain limited.    If plan is discharge home, recommend the following: A little help with walking and/or transfers;A little help with bathing/dressing/bathroom;Assistance with cooking/housework;Assist for transportation;Help with stairs or ramp for entrance   Can travel by private vehicle        Equipment Recommendations  Rolling walker (2 wheels)    Recommendations for Other Services       Precautions / Restrictions Precautions Precautions: Knee;Fall Restrictions Weight Bearing Restrictions Per Provider Order: No Other Position/Activity Restrictions: WBAT     Mobility  Bed Mobility Overal bed mobility: Needs Assistance Bed Mobility: Supine to Sit     Supine to sit: Contact guard     General bed mobility comments: Increased time with cues for sequence and use of LE LE to self assist    Transfers Overall transfer level: Needs assistance Equipment used: Rolling walker (2 wheels) Transfers: Sit to/from Stand Sit to Stand: Min assist, Contact guard assist           General transfer comment: cues for LE management and use of UEs to self assist    Ambulation/Gait Ambulation/Gait assistance: Min assist, Contact guard assist Gait Distance (Feet): 38 Feet Assistive device: Rolling walker (2 wheels) Gait Pattern/deviations: Step-to pattern, Decreased step length - right, Decreased step length - left, Shuffle, Trunk flexed Gait velocity: decr     General Gait Details: cues for sequence, posture and position from [employer]             Wheelchair Mobility     Tilt  Bed    Modified Rankin (Stroke Patients Only)       Balance Overall balance assessment: Needs assistance Sitting-balance support: No upper extremity supported, Feet supported Sitting balance-Leahy Scale: Good     Standing balance support: Single extremity supported Standing balance-Leahy Scale: Poor                              Communication Communication Communication: Impaired Factors Affecting Communication: Hearing impaired  Cognition Arousal: Alert Behavior During Therapy: WFL for tasks assessed/performed   PT - Cognitive impairments: No apparent impairments                         Following commands: Intact      Cueing Cueing Techniques: Verbal cues, Gestural cues  Exercises Total Joint Exercises Ankle Circles/Pumps: AROM, Both, 15 reps, Supine Quad Sets: AROM, Both, 10 reps, Supine Heel Slides: AAROM, Right, 15 reps, Supine Straight Leg Raises: AAROM, AROM, Right, 10 reps, Supine Goniometric ROM: AAROM R knee -6 - 45    General Comments        Pertinent Vitals/Pain Pain Assessment Pain Assessment: 0-10 Pain Score: 7  Pain Location: R knee Pain Descriptors / Indicators: Aching, Sore Pain Intervention(s): Limited activity within patient's tolerance, Monitored during session, Premedicated before session, Ice applied    Home Living  Prior Function            PT Goals (current goals can now be found in the care plan section) Acute Rehab PT Goals Patient Stated Goal: Regain IND and get off this walker PT Goal Formulation: With patient Time For Goal Achievement: 06/02/23 Potential to Achieve Goals: Good Progress towards PT goals: Progressing toward goals    Frequency    7X/week      PT Plan      Co-evaluation              AM-PAC PT "6 Clicks" Mobility   Outcome Measure  Help needed turning from your back to your side while in a flat bed without using bedrails?: A Little Help  needed moving from lying on your back to sitting on the side of a flat bed without using bedrails?: A Little Help needed moving to and from a bed to a chair (including a wheelchair)?: A Little Help needed standing up from a chair using your arms (e.g., wheelchair or bedside chair)?: A Little Help needed to walk in hospital room?: A Little Help needed climbing 3-5 steps with a railing? : A Lot 6 Click Score: 17    End of Session Equipment Utilized During Treatment: Gait belt Activity Tolerance: Patient tolerated treatment well Patient left: in chair;with call bell/phone within reach Nurse Communication: Mobility status PT Visit Diagnosis: Difficulty in walking, not elsewhere classified (R26.2)     Time: 1914-7829 PT Time Calculation (min) (ACUTE ONLY): 32 min  Charges:    $Gait Training: 8-22 mins $Therapeutic Exercise: 8-22 mins PT General Charges $$ ACUTE PT VISIT: 1 Visit                     Mauro Kaufmann PT Acute Rehabilitation Services Pager [phone] Office [phone]    [name] 05/21/2023, 8:40 AM

## 2023-05-22 DIAGNOSIS — M1711 Unilateral primary osteoarthritis, right knee: Secondary | ICD-10-CM | POA: Diagnosis not present

## 2023-05-22 LAB — CBC
HCT: 32.4 % — ABNORMAL LOW (ref 36.0–46.0)
Hemoglobin: 10.6 g/dL — ABNORMAL LOW (ref 12.0–15.0)
MCH: 31.5 pg (ref 26.0–34.0)
MCHC: 32.7 g/dL (ref 30.0–36.0)
MCV: 96.1 fL (ref 80.0–100.0)
Platelets: 210 10*3/uL (ref 150–400)
RBC: 3.37 MIL/uL — ABNORMAL LOW (ref 3.87–5.11)
RDW: 13.8 % (ref 11.5–15.5)
WBC: 12.3 10*3/uL — ABNORMAL HIGH (ref 4.0–10.5)
nRBC: 0 % (ref 0.0–0.2)

## 2023-05-22 NOTE — Discharge Summary (Signed)
 Physician Discharge Summary  Patient ID: Debbie Taylor MRN: [id number] DOB/AGE: Feb 20, 1934 89 y.o.  Admit date: 05/20/2023 Discharge date: 05/22/2023  Admission Diagnoses: R knee OA; hx of lumbar spondylolisthesis, HTN, hypothyroidism  Discharge Diagnoses:  Principal Problem:   Status post total knee replacement, right Same as above  Discharged Condition: stable  Hospital Course: Patient presented to [facility] OR on 05/20/23 for elective R TKA by Dr. Ranell Patrick.  She tolerated the procedure well without complication.  She was then admitted to the hospital.  She worked well with therapy.  She tolerated her stay well without incident.  She is to be D/C'd home with HHPT.  Consults:  PT  Significant Diagnostic Studies: N/A  Treatments: IV hydration, antibiotics: Ancef, analgesia: acetaminophen, Dilaudid, and oxycodone, cardiac meds: irbesartan, anticoagulation: ASA, and surgery: as stated above  Discharge Exam: Blood pressure (!) 165/60, pulse 84, temperature 98.6 F (37 C), resp. rate 18, height 5\' 5"  (1.651 m), weight 81.2 kg, SpO2 96%. General: WDWN patient in NAD. Psych:  Appropriate mood and affect. Neuro:  A&O x 3, Moving all extremities, sensation intact to light touch HEENT:  EOMs intact Chest:  Even non-labored respirations Skin:  Aquacel dressing C/D/I, no rashes or lesions Extremities: warm/dry, mild edema to R knee, no erythema or echymosis.  No lymphadenopathy. Pulses: Popliteus 2+ MSK:  ROM: lacks 5 degrees TKE, MMT: able to perform quad set, (-) Homan's   Disposition: Discharge disposition: 01-Home or Self Care       Discharge Instructions     Call MD / Call 911   Complete by: As directed    If you experience chest pain or shortness of breath, CALL 911 and be transported to the hospital emergency room.  If you develope a fever above 101 F, pus (white drainage) or increased drainage or redness at the wound, or calf pain, call your surgeon's office.   Call MD / Call  911   Complete by: As directed    If you experience chest pain or shortness of breath, CALL 911 and be transported to the hospital emergency room.  If you develope a fever above 101 F, pus (white drainage) or increased drainage or redness at the wound, or calf pain, call your surgeon's office.   Constipation Prevention   Complete by: As directed    Drink plenty of fluids.  Prune juice may be helpful.  You may use a stool softener, such as Colace (over the counter) 100 mg twice a day.  Use MiraLax (over the counter) for constipation as needed.   Constipation Prevention   Complete by: As directed    Drink plenty of fluids.  Prune juice may be helpful.  You may use a stool softener, such as Colace (over the counter) 100 mg twice a day.  Use MiraLax (over the counter) for constipation as needed.   Diet - low sodium heart healthy   Complete by: As directed    Diet - low sodium heart healthy   Complete by: As directed    Discharge patient   Complete by: As directed    Discharge disposition: 01-Home or Self Care   Discharge patient date: 05/21/2023   Increase activity slowly as tolerated   Complete by: As directed    Increase activity slowly as tolerated   Complete by: As directed    Post-operative opioid taper instructions:   Complete by: As directed    POST-OPERATIVE OPIOID TAPER INSTRUCTIONS: It is important to wean off of your opioid medication  as soon as possible. If you do not need pain medication after your surgery it is ok to stop day one. Opioids include: Codeine, Hydrocodone(Norco, Vicodin), Oxycodone(Percocet, oxycontin) and hydromorphone amongst others.  Long term and even short term use of opiods can cause: Increased pain response Dependence Constipation Depression Respiratory depression And more.  Withdrawal symptoms can include Flu like symptoms Nausea, vomiting And more Techniques to manage these symptoms Hydrate well Eat regular healthy meals Stay active Use  relaxation techniques(deep breathing, meditating, yoga) Do Not substitute Alcohol to help with tapering If you have been on opioids for less than two weeks and do not have pain than it is ok to stop all together.  Plan to wean off of opioids This plan should start within one week post op of your joint replacement. Maintain the same interval or time between taking each dose and first decrease the dose.  Cut the total daily intake of opioids by one tablet each day Next start to increase the time between doses. The last dose that should be eliminated is the evening dose.      Post-operative opioid taper instructions:   Complete by: As directed    POST-OPERATIVE OPIOID TAPER INSTRUCTIONS: It is important to wean off of your opioid medication as soon as possible. If you do not need pain medication after your surgery it is ok to stop day one. Opioids include: Codeine, Hydrocodone(Norco, Vicodin), Oxycodone(Percocet, oxycontin) and hydromorphone amongst others.  Long term and even short term use of opiods can cause: Increased pain response Dependence Constipation Depression Respiratory depression And more.  Withdrawal symptoms can include Flu like symptoms Nausea, vomiting And more Techniques to manage these symptoms Hydrate well Eat regular healthy meals Stay active Use relaxation techniques(deep breathing, meditating, yoga) Do Not substitute Alcohol to help with tapering If you have been on opioids for less than two weeks and do not have pain than it is ok to stop all together.  Plan to wean off of opioids This plan should start within one week post op of your joint replacement. Maintain the same interval or time between taking each dose and first decrease the dose.  Cut the total daily intake of opioids by one tablet each day Next start to increase the time between doses. The last dose that should be eliminated is the evening dose.      Weight bearing as tolerated   Complete  by: As directed    Laterality: right   Extremity: Lower      Allergies as of 05/22/2023       Reactions   Calcium Channel Blockers    Other reaction(s): amlodipine-swelling in legs   Codeine Hives, Itching   Crestor [rosuvastatin]    Other reaction(s): myalgias   Statins    Muscle pain   Zetia [ezetimibe]    Muscle Pain   Celebrex [celecoxib] Palpitations        Medication List     TAKE these medications    aspirin 81 MG chewable tablet Commonly known as: Aspirin Childrens Chew 1 tablet (81 mg total) by mouth 2 (two) times daily.   Calcium 600+D 600-800 MG-UNIT Tabs Generic drug: Calcium Carb-Cholecalciferol Take 1 tablet by mouth every evening.   DULoxetine 60 MG capsule Commonly known as: CYMBALTA Take 60 mg by mouth at bedtime.   EPINEPHrine 0.3 mg/0.3 mL Soaj injection Commonly known as: EPI-PEN Inject 0.3 mg into the muscle as needed for anaphylaxis.   estradiol 0.1 MG/GM vaginal cream Commonly known as:  ESTRACE Place 1 Applicatorful vaginally 2 (two) times a week.   irbesartan 75 MG tablet Commonly known as: AVAPRO Take 75 mg by mouth in the morning.   levothyroxine 88 MCG tablet Commonly known as: SYNTHROID Take 88 mcg by mouth daily before breakfast.   methocarbamol 500 MG tablet Commonly known as: ROBAXIN Take 1 tablet (500 mg total) by mouth every 8 (eight) hours as needed for muscle spasms.   metoprolol succinate 25 MG 24 hr tablet Commonly known as: TOPROL-XL Take 25 mg by mouth in the morning.   Myrbetriq 25 MG Tb24 tablet Generic drug: mirabegron ER Take 25 mg by mouth at bedtime.   ondansetron 4 MG tablet Commonly known as: Zofran Take 1 tablet (4 mg total) by mouth every 8 (eight) hours as needed for refractory nausea / vomiting, vomiting or nausea.   oxyCODONE-acetaminophen 5-325 MG tablet Commonly known as: Percocet Take 1-2 tablets by mouth every 6 (six) hours as needed for severe pain (pain score 7-10).   pantoprazole 40  MG tablet Commonly known as: PROTONIX Take 40 mg by mouth in the morning.   PROBIOTIC DAILY PO Take 1 capsule by mouth daily with supper.               Discharge Care Instructions  (From admission, onward)           Start     Ordered   05/22/23 0000  Weight bearing as tolerated       Question Answer Comment  Laterality right   Extremity Lower      05/22/23 0829            Follow-up Information     Beverely Low, MD. Call in 2 week(s).   Specialty: Orthopedic Surgery Why: please call office [phone] for appt in two weeks Contact information: [address] [place] [address] [phone]                 Signed: Lolly Mustache Office:  [phone]

## 2023-05-22 NOTE — Progress Notes (Signed)
 Subjective: 2 Days Post-Op Procedure(s) (LRB): ARTHROPLASTY, KNEE, TOTAL (Right)  Patient reports pain as mild to moderate.  Denies fever, chills, N/V, CP, SOB.  Tolerating POs well. Admits to flatus.  Objective:   VITALS:  Temp:  [97.7 F (36.5 C)-98.7 F (37.1 C)] 98.6 F (37 C) (03/30 0435) Pulse Rate:  [79-88] 84 (03/30 0435) Resp:  [15-19] 18 (03/30 0435) BP: (112-165)/(56-60) 165/60 (03/30 0435) SpO2:  [95 %-97 %] 96 % (03/30 0435)  General: WDWN patient in NAD. Psych:  Appropriate mood and affect. Neuro:  A&O x 3, Moving all extremities, sensation intact to light touch HEENT:  EOMs intact Chest:  Even non-labored respirations Skin:  Aquacel dressing C/D/I, no rashes or lesions Extremities: warm/dry, mild edema to R knee, no erythema or echymosis.  No lymphadenopathy. Pulses: Popliteus 2+ MSK:  ROM: lacks 5 degrees TKE, MMT: able to perform quad set, (-) Homan's    LABS Recent Labs    05/21/23 0346 05/22/23 0323  HGB 11.1* 10.6*  WBC 11.7* 12.3*  PLT 219 210   Recent Labs    05/21/23 0346  NA 138  K 4.1  CL 108  CO2 22  BUN 27*  CREATININE 1.19*  GLUCOSE 152*   No results for input(s): "LABPT", "INR" in the last 72 hours.   Assessment/Plan: 2 Days Post-Op Procedure(s) (LRB): ARTHROPLASTY, KNEE, TOTAL (Right)  Patient seen in rounds for Dr. Ma Hillock R LE with walker Up with therapy D/C home today upon clearance from PT.  D/C order placed. Plan for outpatient post-op visit with Dr. Linus Salmons [facility] Office:  [phone]

## 2023-05-22 NOTE — TOC Transition Note (Signed)
 Transition of Care [facility]) - Discharge Note   Patient Details  Name: Josselyn Harkins MRN: [id number] Date of Birth: [birth year]  [facility] CM/SW Contact:  Amada Jupiter, LCSW Phone Number: 05/22/2023, 9:48 AM   Clinical Narrative:     Met with pt and explaining that insurance will only cover one mobility device and she understands that CSW only placing order for a RW.  Order in from ortho PA and sent to [employer] [facility] with request for delivery of RW to room today.  OPPT already arranged.  No further TOC needs.  Final next level of care: OP Rehab Barriers to Discharge: No Barriers Identified   Patient Goals and CMS Choice Patient states their goals for this hospitalization and ongoing recovery are:: return home          Discharge Placement                       Discharge Plan and Services Additional resources added to the After Visit Summary for                  DME Arranged: Walker rolling DME Agency: [employer] Date DME Agency Contacted: 05/22/23 Time DME Agency Contacted: [phone] Representative spoke with at DME Agency: [facility]            Social Drivers of Health (SDOH) Interventions SDOH Screenings   Food Insecurity: No Food Insecurity (05/20/2023)  Housing: Unknown (05/20/2023)  Transportation Needs: No Transportation Needs (05/20/2023)  Utilities: Not At Risk (05/20/2023)  Social Connections: Moderately Isolated (05/20/2023)  Tobacco Use: Low Risk  (05/20/2023)     Readmission Risk Interventions     No data to display

## 2023-05-22 NOTE — Progress Notes (Signed)
 Physical Therapy Treatment Patient Details Name: Debbie Taylor MRN: [id number] DOB: October 09, 1933 Today's Date: 05/22/2023   History of Present Illness Pt s/p R TKR and with hx of back surgery and PE    PT Comments  Pt continues cooperative but fatigued.  Family present for session.  Pt performed HEP with written instruction provided, ambulated limited distance in hall, negotiated stairs, and with multiple questions asked and answered.  Pt eager for dc home this date.   If plan is discharge home, recommend the following: A little help with walking and/or transfers;A little help with bathing/dressing/bathroom;Assistance with cooking/housework;Assist for transportation;Help with stairs or ramp for entrance   Can travel by private vehicle        Equipment Recommendations  Rolling walker (2 wheels)    Recommendations for Other Services       Precautions / Restrictions Precautions Precautions: Knee;Fall Restrictions Weight Bearing Restrictions Per Provider Order: No Other Position/Activity Restrictions: WBAT     Mobility  Bed Mobility               General bed mobility comments: Pt up in chair and returns to same    Transfers Overall transfer level: Needs assistance Equipment used: Rolling walker (2 wheels) Transfers: Sit to/from Stand Sit to Stand: Contact guard assist, Supervision           General transfer comment: cues for LE management and use of UEs to self assist    Ambulation/Gait Ambulation/Gait assistance: Contact guard assist Gait Distance (Feet): 35 Feet Assistive device: Rolling walker (2 wheels) Gait Pattern/deviations: Step-to pattern, Decreased step length - right, Decreased step length - left, Shuffle, Trunk flexed Gait velocity: decr     General Gait Details: cues for sequence, posture and position from RW   Stairs Stairs: Yes Stairs assistance: Min assist Stair Management: One rail Right, Forwards, Step to pattern, With cane Number  of Stairs: 3 General stair comments: cues for sequence and foot/cane placement;   Wheelchair Mobility     Tilt Bed    Modified Rankin (Stroke Patients Only)       Balance Overall balance assessment: Needs assistance Sitting-balance support: No upper extremity supported, Feet supported Sitting balance-Leahy Scale: Good     Standing balance support: Single extremity supported Standing balance-Leahy Scale: Poor                              Communication Communication Communication: Impaired Factors Affecting Communication: Hearing impaired  Cognition Arousal: Alert Behavior During Therapy: WFL for tasks assessed/performed   PT - Cognitive impairments: No apparent impairments                         Following commands: Intact      Cueing Cueing Techniques: Verbal cues, Gestural cues  Exercises Total Joint Exercises Ankle Circles/Pumps: AROM, Both, Supine, 10 reps Quad Sets: AROM, Both, Supine, 5 reps Heel Slides: AAROM, Right, 15 reps, Supine Straight Leg Raises: AAROM, AROM, Right, Supine, 10 reps Long Arc Quad: AAROM, Right, 10 reps, Seated    General Comments        Pertinent Vitals/Pain Pain Assessment Pain Assessment: 0-10 Pain Score: 6  Pain Location: R knee Pain Descriptors / Indicators: Aching, Sore Pain Intervention(s): Limited activity within patient's tolerance, Monitored during session, Premedicated before session    Home Living  Prior Function            PT Goals (current goals can now be found in the care plan section) Acute Rehab PT Goals Patient Stated Goal: Regain IND and get off this walker PT Goal Formulation: With patient Time For Goal Achievement: 06/02/23 Potential to Achieve Goals: Good Progress towards PT goals: Progressing toward goals    Frequency    7X/week      PT Plan      Co-evaluation              AM-PAC PT "6 Clicks" Mobility   Outcome  Measure  Help needed turning from your back to your side while in a flat bed without using bedrails?: A Little Help needed moving from lying on your back to sitting on the side of a flat bed without using bedrails?: A Little Help needed moving to and from a bed to a chair (including a wheelchair)?: A Little Help needed standing up from a chair using your arms (e.g., wheelchair or bedside chair)?: A Little Help needed to walk in hospital room?: A Little Help needed climbing 3-5 steps with a railing? : A Little 6 Click Score: 18    End of Session Equipment Utilized During Treatment: Gait belt Activity Tolerance: Patient tolerated treatment well Patient left: in chair;with call bell/phone within reach;with chair alarm set Nurse Communication: Mobility status;Other (comment) PT Visit Diagnosis: Difficulty in walking, not elsewhere classified (R26.2)     Time: 7846-9629 PT Time Calculation (min) (ACUTE ONLY): 45 min  Charges:    $Gait Training: 8-22 mins $Therapeutic Exercise: 8-22 mins $Therapeutic Activity: 8-22 mins PT General Charges $$ ACUTE PT VISIT: 1 Visit                     Mauro Kaufmann PT Acute Rehabilitation Services Pager [phone] Office [phone]    Gyselle Matthew 05/22/2023, 12:55 PM

## 2023-05-22 NOTE — Progress Notes (Signed)
 Physical Therapy Treatment Patient Details Name: Debbie Taylor MRN: [id number] DOB: [birth year] Today's Date: 05/22/2023   History of Present Illness Pt s/p R TKR and with hx of back surgery and PE    PT Comments  Pt very cooperative and HEP completed with assist.  Pt requests mobility deferred until family is present.    If plan is discharge home, recommend the following: A little help with walking and/or transfers;A little help with bathing/dressing/bathroom;Assistance with cooking/housework;Assist for transportation;Help with stairs or ramp for entrance   Can travel by private vehicle        Equipment Recommendations  Rolling walker (2 wheels)    Recommendations for Other Services       Precautions / Restrictions Precautions Precautions: Knee;Fall Restrictions Weight Bearing Restrictions Per Provider Order: No Other Position/Activity Restrictions: WBAT     Mobility  Bed Mobility               General bed mobility comments: Pt up in chair    Transfers                        Ambulation/Gait                   Stairs             Wheelchair Mobility     Tilt Bed    Modified Rankin (Stroke Patients Only)       Balance                                            Communication Communication Communication: Impaired Factors Affecting Communication: Hearing impaired  Cognition Arousal: Alert Behavior During Therapy: WFL for tasks assessed/performed   PT - Cognitive impairments: No apparent impairments                         Following commands: Intact      Cueing Cueing Techniques: Verbal cues, Gestural cues  Exercises Total Joint Exercises Ankle Circles/Pumps: AROM, Both, 15 reps, Supine Quad Sets: AROM, Both, 10 reps, Supine Heel Slides: AAROM, Right, 15 reps, Supine Straight Leg Raises: AAROM, AROM, Right, Supine, 20 reps Long Arc Quad: AAROM, Right, 10 reps, Seated    General  Comments        Pertinent Vitals/Pain Pain Assessment Pain Assessment: 0-10 Pain Score: 6  Pain Location: R knee Pain Descriptors / Indicators: Aching, Sore Pain Intervention(s): Limited activity within patient's tolerance, Premedicated before session, Monitored during session, Ice applied    Home Living                          Prior Function            PT Goals (current goals can now be found in the care plan section) Acute Rehab PT Goals Patient Stated Goal: Regain IND and get off this walker PT Goal Formulation: With patient Time For Goal Achievement: 06/02/23 Potential to Achieve Goals: Good Progress towards PT goals: Progressing toward goals    Frequency    7X/week      PT Plan      Co-evaluation              AM-PAC PT "6 Clicks" Mobility   Outcome Measure  Help needed turning from your back to your  side while in a flat bed without using bedrails?: A Little Help needed moving from lying on your back to sitting on the side of a flat bed without using bedrails?: A Little Help needed moving to and from a bed to a chair (including a wheelchair)?: A Little Help needed standing up from a chair using your arms (e.g., wheelchair or bedside chair)?: A Little Help needed to walk in hospital room?: A Little Help needed climbing 3-5 steps with a railing? : A Little 6 Click Score: 18    End of Session Equipment Utilized During Treatment: Gait belt Activity Tolerance: Patient tolerated treatment well Patient left: in chair;with call bell/phone within reach;with chair alarm set Nurse Communication: Mobility status;Other (comment) PT Visit Diagnosis: Difficulty in walking, not elsewhere classified (R26.2)     Time: 1610-9604 PT Time Calculation (min) (ACUTE ONLY): 31 min  Charges:    $Therapeutic Exercise: 23-37 mins PT General Charges $$ ACUTE PT VISIT: 1 Visit                     Mauro Kaufmann PT Acute Rehabilitation Services Pager  [phone] Office [phone]    Kodie Kishi 05/22/2023, 12:48 PM

## 2023-05-23 ENCOUNTER — Encounter (HOSPITAL_COMMUNITY): Payer: Self-pay | Admitting: Orthopedic Surgery

## 2023-10-27 IMAGING — CT CT L SPINE W/ CM
1 of 8 series · 5 of 14 positions shown, 7 images · IV contrast (isovue)
Comparison: Myelogram imaging same day.  MRI 01/09/2021.

CLINICAL DATA: Lumbar radiculopathy.

EXAM:
CT MYELOGRAPHY LUMBAR SPINE
TECHNIQUE: CT imaging of the lumbar spine was performed after Isovue 200M
contrast administration. Multiplanar CT image reconstructions were
also generated.
RADIATION DOSE REDUCTION: This exam was performed according to the
departmental dose-optimization program which includes automated
exposure control, adjustment of the mA and/or kV according to
patient size and/or use of iterative reconstruction technique.

[Series 3: l spine soft · axial · 0.43mm/px · z∈[-850,-650]mm · 5 of 151 slices shown, 7 images]
[im 26/151  soft-tissue]
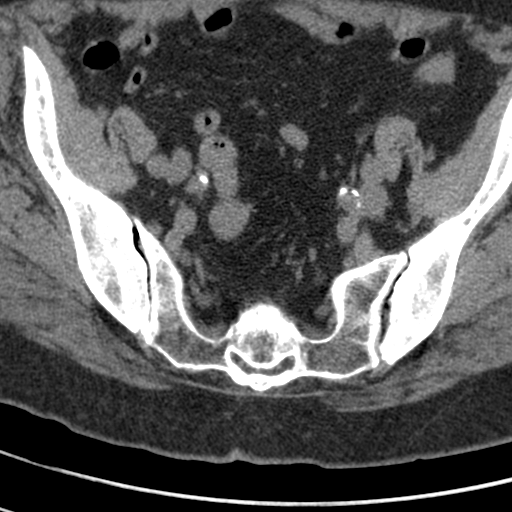
[im 26/151  bone]
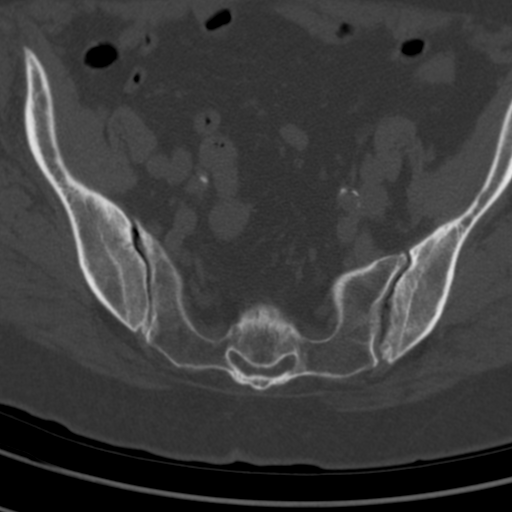
[im 51/151  bone]
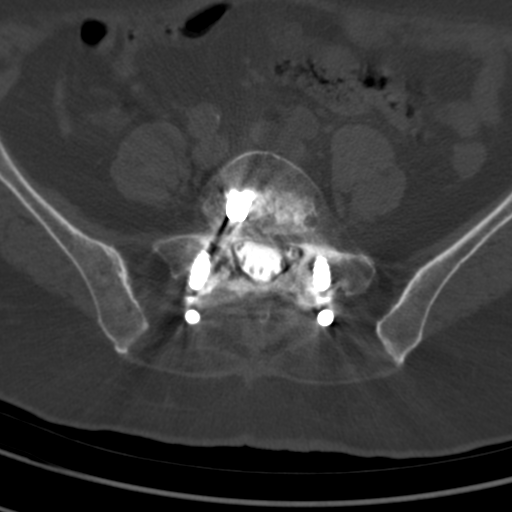
[im 76/151  bone]
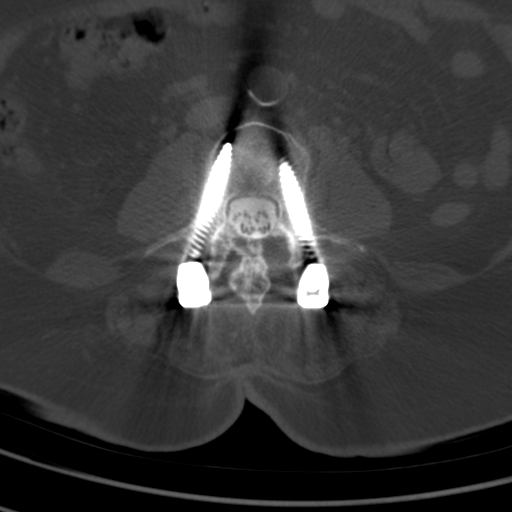
[im 101/151  bone]
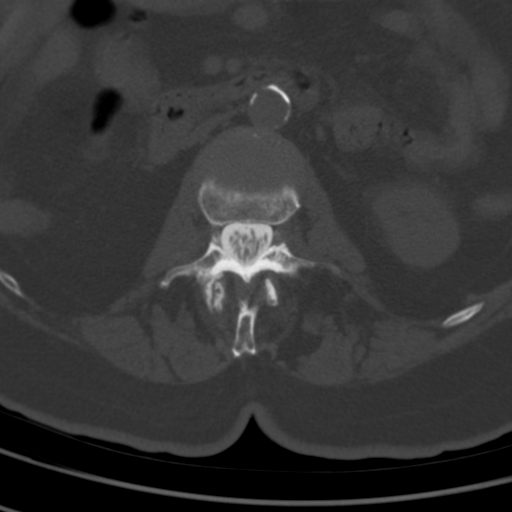
[im 126/151  soft-tissue]
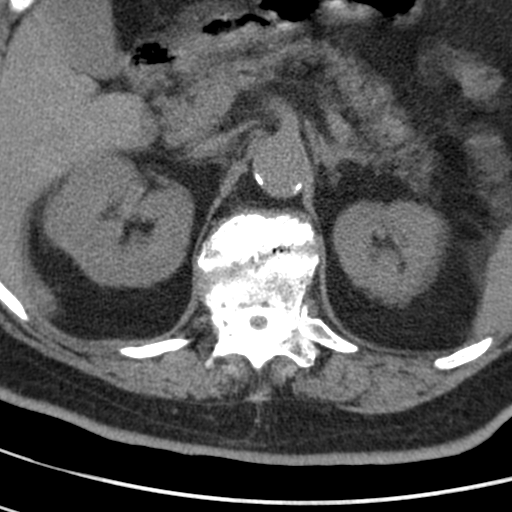
[im 126/151  bone]
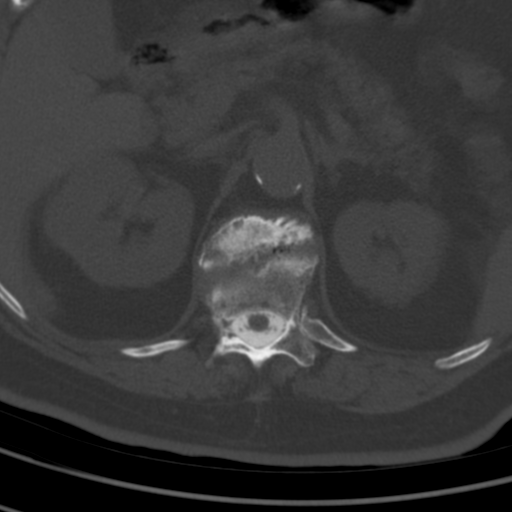

[5 of 14 positions shown; findings below may reference images not displayed]

FINDINGS: Myelogram reported separately. Previous discectomy and fusion from
L3 to the sacrum. Interbody spacers, posterior rods and pedicle
screws. Components appear well positioned. No sign of loosening. No
motion in this segment with flexion extension. Rocking motion at the
L2-3 and L1-2 levels with degenerative retrolisthesis of 2-3 mm at
those levels.

Segmentation:  5 lumbar type vertebral bodies.

Alignment: 3 mm retrolisthesis T12-L1. 6 mm retrolisthesis L1-2. 3
mm retrolisthesis L2-3.

Vertebrae: Distant fusion procedure from L3 to the sacrum with
apparent solid union and no sign of hardware loosening or motion.

Conus medullaris: Extends to the L1-2 level and appears normal.

Paraspinal and other soft tissues: Aortic atherosclerosis.

Disc levels:

T11-12: Disc degeneration. Minimal disc bulge. Facet osteoarthritis.
No compressive stenosis.

T12-L1: Retrolisthesis of 3 mm. Bulging of the disc. Facet
osteoarthritis. No compressive stenosis.

L1-2: Retrolisthesis of 6 mm. Disc degeneration but no disc
herniation. Facet osteoarthritis. No compressive canal stenosis.
Bilateral foraminal stenosis.

L2-3: Retrolisthesis of 3 mm. No disc herniation. Bilateral facet
osteoarthritis. No compressive canal stenosis. Bilateral foraminal
narrowing.

L3 to sacrum: As above, previous diskectomy, decompression and
fusion. Solid union with wide patency of the canal and foramina. No
evidence of motion or hardware loosening.

Bilateral sacroiliac osteoarthritis is present.
IMPRESSION: Good appearance in the fusion segment from L3 to the sacrum. Solid
union with wide patency of the canal and foramina.

Degenerative disease from T11-12 through L2-3. Degenerative
retrolisthesis at T12-L1, L1-2 and L2-3. The facet joints are
degenerated and somewhat sagittal in orientation, which could
predispose to the slippage. Disc degeneration at those levels. No
apparent compressive canal stenosis. Foraminal stenosis at T12-L1,
L1-2 and L2-3 that could be significant. The findings in general
could certainly relate to regional back pain.

Bilateral sacroiliac osteoarthritis which could be painful.
